# Patient Record
Sex: Male | Born: 1965 | Race: White | Hispanic: No | Smoking: Never smoker
Health system: Southern US, Community
[De-identification: ages and names within clinical notes are randomized; demographics above are authoritative.]

## PROBLEM LIST (undated history)

## (undated) DIAGNOSIS — Z87442 Personal history of urinary calculi: Secondary | ICD-10-CM

## (undated) DIAGNOSIS — N059 Unspecified nephritic syndrome with unspecified morphologic changes: Secondary | ICD-10-CM

## (undated) DIAGNOSIS — K219 Gastro-esophageal reflux disease without esophagitis: Secondary | ICD-10-CM

## (undated) DIAGNOSIS — L0390 Cellulitis, unspecified: Secondary | ICD-10-CM

## (undated) DIAGNOSIS — I10 Essential (primary) hypertension: Secondary | ICD-10-CM

## (undated) DIAGNOSIS — I872 Venous insufficiency (chronic) (peripheral): Secondary | ICD-10-CM

## (undated) DIAGNOSIS — G4730 Sleep apnea, unspecified: Secondary | ICD-10-CM

## (undated) DIAGNOSIS — A4902 Methicillin resistant Staphylococcus aureus infection, unspecified site: Secondary | ICD-10-CM

## (undated) DIAGNOSIS — M1990 Unspecified osteoarthritis, unspecified site: Secondary | ICD-10-CM

## (undated) DIAGNOSIS — R569 Unspecified convulsions: Secondary | ICD-10-CM

## (undated) HISTORY — PX: KNEE ARTHROSCOPY: SUR90

## (undated) HISTORY — PX: MOUTH SURGERY: SHX715

## (undated) HISTORY — PX: APPENDECTOMY: SHX54

## (undated) HISTORY — PX: SHOULDER SURGERY: SHX246

---

## 1999-04-26 ENCOUNTER — Ambulatory Visit: Admission: RE | Admit: 1999-04-26 | Discharge: 1999-04-26 | Payer: Self-pay | Admitting: Family Medicine

## 2002-03-09 ENCOUNTER — Observation Stay (HOSPITAL_COMMUNITY): Admission: RE | Admit: 2002-03-09 | Discharge: 2002-03-10 | Payer: Self-pay | Admitting: Specialist

## 2003-01-21 ENCOUNTER — Observation Stay (HOSPITAL_COMMUNITY): Admission: RE | Admit: 2003-01-21 | Discharge: 2003-01-22 | Payer: Self-pay | Admitting: Specialist

## 2003-06-09 ENCOUNTER — Ambulatory Visit (HOSPITAL_BASED_OUTPATIENT_CLINIC_OR_DEPARTMENT_OTHER): Admission: RE | Admit: 2003-06-09 | Discharge: 2003-06-09 | Payer: Self-pay | Admitting: Specialist

## 2004-09-23 ENCOUNTER — Emergency Department (HOSPITAL_COMMUNITY): Admission: EM | Admit: 2004-09-23 | Discharge: 2004-09-24 | Payer: Self-pay | Admitting: Emergency Medicine

## 2006-01-14 ENCOUNTER — Ambulatory Visit (HOSPITAL_COMMUNITY): Admission: RE | Admit: 2006-01-14 | Discharge: 2006-01-14 | Payer: Self-pay | Admitting: Family Medicine

## 2008-02-23 ENCOUNTER — Ambulatory Visit (HOSPITAL_COMMUNITY): Admission: RE | Admit: 2008-02-23 | Discharge: 2008-02-23 | Payer: Self-pay | Admitting: Family Medicine

## 2008-08-17 ENCOUNTER — Emergency Department (HOSPITAL_COMMUNITY): Admission: EM | Admit: 2008-08-17 | Discharge: 2008-08-17 | Payer: Self-pay | Admitting: Emergency Medicine

## 2008-12-28 ENCOUNTER — Emergency Department (HOSPITAL_COMMUNITY): Admission: EM | Admit: 2008-12-28 | Discharge: 2008-12-28 | Payer: Self-pay | Admitting: Emergency Medicine

## 2008-12-28 DIAGNOSIS — I499 Cardiac arrhythmia, unspecified: Secondary | ICD-10-CM

## 2008-12-28 HISTORY — DX: Cardiac arrhythmia, unspecified: I49.9

## 2009-01-02 ENCOUNTER — Ambulatory Visit: Payer: Self-pay | Admitting: Cardiology

## 2009-01-06 ENCOUNTER — Encounter: Payer: Self-pay | Admitting: Cardiology

## 2009-01-06 ENCOUNTER — Ambulatory Visit (HOSPITAL_COMMUNITY): Admission: RE | Admit: 2009-01-06 | Discharge: 2009-01-06 | Payer: Self-pay | Admitting: Cardiology

## 2009-01-06 ENCOUNTER — Ambulatory Visit: Payer: Self-pay | Admitting: Internal Medicine

## 2009-01-20 ENCOUNTER — Ambulatory Visit: Payer: Self-pay | Admitting: Cardiology

## 2009-01-25 ENCOUNTER — Ambulatory Visit (HOSPITAL_COMMUNITY): Admission: RE | Admit: 2009-01-25 | Discharge: 2009-01-25 | Payer: Self-pay | Admitting: Cardiology

## 2009-05-14 ENCOUNTER — Emergency Department (HOSPITAL_COMMUNITY): Admission: EM | Admit: 2009-05-14 | Discharge: 2009-05-14 | Payer: Self-pay | Admitting: Emergency Medicine

## 2009-05-15 DIAGNOSIS — G4730 Sleep apnea, unspecified: Secondary | ICD-10-CM | POA: Insufficient documentation

## 2009-05-16 ENCOUNTER — Ambulatory Visit: Payer: Self-pay | Admitting: Cardiology

## 2009-05-17 ENCOUNTER — Emergency Department (HOSPITAL_COMMUNITY): Admission: EM | Admit: 2009-05-17 | Discharge: 2009-05-17 | Payer: Self-pay | Admitting: Emergency Medicine

## 2009-05-18 ENCOUNTER — Telehealth (INDEPENDENT_AMBULATORY_CARE_PROVIDER_SITE_OTHER): Payer: Self-pay

## 2009-05-19 ENCOUNTER — Emergency Department (HOSPITAL_COMMUNITY): Admission: EM | Admit: 2009-05-19 | Discharge: 2009-05-19 | Payer: Self-pay | Admitting: Emergency Medicine

## 2009-08-09 ENCOUNTER — Emergency Department (HOSPITAL_COMMUNITY): Admission: EM | Admit: 2009-08-09 | Discharge: 2009-08-09 | Payer: Self-pay | Admitting: Emergency Medicine

## 2009-11-18 DIAGNOSIS — D165 Benign neoplasm of lower jaw bone: Secondary | ICD-10-CM

## 2009-11-18 HISTORY — DX: Benign neoplasm of lower jaw bone: D16.5

## 2010-01-03 ENCOUNTER — Ambulatory Visit (HOSPITAL_COMMUNITY): Admission: RE | Admit: 2010-01-03 | Discharge: 2010-01-03 | Payer: Self-pay | Admitting: Oral Surgery

## 2010-01-08 ENCOUNTER — Encounter (INDEPENDENT_AMBULATORY_CARE_PROVIDER_SITE_OTHER): Payer: Self-pay | Admitting: *Deleted

## 2010-01-08 LAB — CONVERTED CEMR LAB
AST: 20 units/L
Albumin: 4 g/dL
CO2: 26 meq/L
Creatinine, Ser: 1.03 mg/dL
Glucose, Bld: 91 mg/dL
Hgb A1c MFr Bld: 6.1 %
LDL Cholesterol: 58 mg/dL
Sodium: 139 meq/L
TSH: 1.225 microintl units/mL
Total Protein: 6.4 g/dL
Triglycerides: 64 mg/dL

## 2010-01-15 ENCOUNTER — Encounter (INDEPENDENT_AMBULATORY_CARE_PROVIDER_SITE_OTHER): Payer: Self-pay | Admitting: *Deleted

## 2010-01-17 ENCOUNTER — Ambulatory Visit: Payer: Self-pay | Admitting: Cardiology

## 2010-01-18 ENCOUNTER — Encounter: Payer: Self-pay | Admitting: Adult Health

## 2010-03-28 ENCOUNTER — Emergency Department (HOSPITAL_COMMUNITY): Admission: EM | Admit: 2010-03-28 | Discharge: 2010-03-29 | Payer: Self-pay | Admitting: Emergency Medicine

## 2010-03-29 ENCOUNTER — Encounter: Payer: Self-pay | Admitting: Adult Health

## 2010-03-29 ENCOUNTER — Ambulatory Visit: Payer: Self-pay | Admitting: Cardiology

## 2010-03-29 DIAGNOSIS — R079 Chest pain, unspecified: Secondary | ICD-10-CM | POA: Insufficient documentation

## 2010-03-29 DIAGNOSIS — E669 Obesity, unspecified: Secondary | ICD-10-CM | POA: Insufficient documentation

## 2010-04-03 ENCOUNTER — Ambulatory Visit (HOSPITAL_COMMUNITY)
Admission: RE | Admit: 2010-04-03 | Discharge: 2010-04-04 | Payer: Self-pay | Source: Home / Self Care | Admitting: Oral Surgery

## 2010-04-03 ENCOUNTER — Encounter (INDEPENDENT_AMBULATORY_CARE_PROVIDER_SITE_OTHER): Payer: Self-pay | Admitting: Oral Surgery

## 2010-04-18 ENCOUNTER — Encounter (HOSPITAL_COMMUNITY): Admission: RE | Admit: 2010-04-18 | Discharge: 2010-04-19 | Payer: Self-pay | Admitting: Cardiology

## 2010-04-18 ENCOUNTER — Ambulatory Visit: Payer: Self-pay | Admitting: Cardiology

## 2010-04-23 ENCOUNTER — Ambulatory Visit: Payer: Self-pay | Admitting: Cardiology

## 2010-06-18 ENCOUNTER — Ambulatory Visit (HOSPITAL_COMMUNITY): Admission: RE | Admit: 2010-06-18 | Discharge: 2010-06-19 | Payer: Self-pay | Admitting: Oral Surgery

## 2010-12-20 NOTE — Assessment & Plan Note (Signed)
Summary: F/U MYOVIEW TO BE DONE ON 04/18/10/TG   Visit Type:  Follow-up Primary Provider:  Dr.Stephen Sudie Bailey   History of Present Illness: John [name] returns to the office as scheduled for continued assessment and treatment of chest discomfort.  He has not experienced recurrence since his initial episode.  He has a history of gastroesophageal reflux disease, uses a PPI occasionally, but does not feel that this episode was similar to those symptoms.  He describes sharp upper substernal chest discomfort radiating through to the back.  There is mild associated dyspnea and diaphoresis without nausea.  He was awakened from sleep with these symptoms, which resolved after he arose from bed.  Current Medications (verified): 1)  Diltiazem Hcl Er Beads 360 Mg Xr24h-Cap (Diltiazem Hcl Er Beads) .... Take 1 Tab Daily 2)  Diltiazem Hcl Er Beads 240 Mg Xr24h-Cap (Diltiazem Hcl Er Beads) .... Take 1 Tab Daily 3)  Lasix 40 Mg Tabs (Furosemide) .... Take 1 Tab Daily 4)  Klor-Con M20 20 Meq Cr-Tabs (Potassium Chloride Crys Cr) .... Take 1 Tab Two Times A Day 5)  Glucosamine 500 Mg Caps (Glucosamine Sulfate) .... Take 1 Tab Two Times A Day 6)  Aspir-Trin 325 Mg Tbec (Aspirin) .... Take 1 Tab Daily 7)  Mobic 7.5 Mg Tabs (Meloxicam) .... Take 1 Tab Daily 8)  Norco 5-325 Mg Tabs (Hydrocodone-Acetaminophen) .... Take As Needed For Pain  Allergies (verified): 1)  ! Penicillin  Past History:  PMH, FH, and Social History reviewed and updated.  Past Medical History: PAROXYSMAL ATRIAL FIBRILLATION (ICD-427.31) Chest pain-negative stress nuclear study in 04/2010 SLEEP APNEA (ICD-780.57) MORBID OBESITY (ICD-278.01) Plantar fasciitis  Review of Systems       See history of present illness.  Vital Signs:  Patient profile:   45 year old male Weight:      371 pounds Pulse rate:   75 / minute BP sitting:   124 / 86  (right arm)  Vitals Entered By: John Saa, CNA (April 23, 2010 3:02  PM)  Physical Exam  General:    Overweight; no acute distress:    Neck-No JVD, no carotid bruits: Lungs-No tachypnea, no rales; no rhonchi;no wheezes; decreased breath sounds at the bases; minimal rales at the left base-clear with cough Cardiovascular-normal PMI; normal S1 and S2; S4 present Abdomen-BS normal; soft and non-tender without masses or organomegaly:  Skin-Warm, no significant lesions: Extremities-Nl distal pulses; 1/2+ edema; fairly prominent varicose veins   Impression & Recommendations:  Problem # 1:  ATRIAL FIBRILLATION-PAROXYSMAL (ICD-427.31) No recurrence of arrhythmia has been documented since initial onset more than one year ago; however, patient was asymptomatic at that time.  Current medications for this problem have been well-tolerated and do not convey a significant risk.  Problem # 2:  CHEST PAIN (ICD-786.50) Symptoms have apparently resolved.  Stress nuclear study was of suboptimal quality, but no definite evidence for ischemia or infarction was noted.  LV systolic function was normal.  No further evaluation or treatment is necessary at the present time.  I will plan to reassess this nice gentleman in one year.  He is cautioned to call earlier should chest discomfort return or other symptoms develop with a likely cardiovascular etiology.  Patient Instructions: 1)  Your physician recommends that you schedule a follow-up appointment in: 1 year

## 2010-12-20 NOTE — Assessment & Plan Note (Signed)
Summary: ROV CHEST PAIN AND BACK PAIN   Visit Type:  Follow-up Primary Provider:  Dr.Stephen Sudie Bailey  CC:  .Marland Kitchen  History of Present Illness: [name] is a 45 y/o morbidly obese CM with known history of SVT, Right heart enlargement and diliation  per echo Feb 2010, OSA with use of CPAP; who we are seeing today after being seen in the ER for complaints of chest pain.  His pain began on 03/27/2010 and is described as "sharp, tight, radiating to the back between the shoulder blades and down the R arm.  He was also diaphoretic with this.  He took an Aciphex and sat up.  Pain eventially diminshed but remained.  The following evening the pain became worse again, but this time radiated into his neck as well, but not down his Right arm.  He was seen in the ER and labs did not reveal any abnormalities with the exception of a low normal K+ 3.4,  CXR did not show CHF but did show cardiomegaly.  EKG showed HR of 48bpm without ischemic changes.  He was given Rx for vicodin and asked to follow-up with [place] and his primary care physician.  He is without pain at this time, having taken the Vicodin.  He admits to chronic back, knee and shoulder pain, but none occuring all at once while lying down.  He admits to inconsistancy with using CPAP, and taking some medications.  Current Medications (verified): 1)  Diltiazem Hcl Er Beads 360 Mg Xr24h-Cap (Diltiazem Hcl Er Beads) .... Take 1 Tab Daily 2)  Diltiazem Hcl Er Beads 240 Mg Xr24h-Cap (Diltiazem Hcl Er Beads) .... Take 1 Tab Daily 3)  Lasix 40 Mg Tabs (Furosemide) .... Take 1 Tab Daily 4)  Klor-Con M20 20 Meq Cr-Tabs (Potassium Chloride Crys Cr) .... Take 1 Tab Daily 5)  Glucosamine 500 Mg Caps (Glucosamine Sulfate) .... Take 1 Tab Two Times A Day 6)  Aspir-Trin 325 Mg Tbec (Aspirin) .... Take 1 Tab Daily 7)  Mobic 7.5 Mg Tabs (Meloxicam) .... Take 1 Tab Daily 8)  Norco 5-325 Mg Tabs (Hydrocodone-Acetaminophen) .... Take As Needed For Pain  Allergies  (verified): 1)  ! Penicillin  Review of Systems       Radiation to the back, neck and right shoulder.  All other systems have been reviewed and are negative unless stated above.   Vital Signs:  Patient profile:   45 year old male Weight:      376 pounds Pulse rate:   58 / minute BP sitting:   128 / 86  (right arm)  Vitals Entered By: Dreama Saa, CNA (Mar 29, 2010 11:31 AM)  Physical Exam  General:  Well developed, well nourished, in no acute distress. Neck:  Obese Lungs:  Clear bilaterally to auscultation and percussion. Heart:  Non-displaced PMI, chest non-tender; regular rate and rhythm, S1, S2 without murmurs, rubs or gallops. Carotid upstroke normal, no bruit. Normal abdominal aortic size, no bruits. Femorals normal pulses, no bruits. Pedals normal pulses.  Abdomen:  Bowel sounds positive; abdomen soft and non-tender without masses, organomegaly, or hernias noted. No hepatosplenomegaly. Obese Very Msk:  joint tenderness bilateral knees and in neck with ROM, also in lower back with palpation.  Some ROM discomfort moving R shoulder and arm. Pulses:  pulses normal in all 4 extremities Extremities:  Bilateral dependent edema with multiple varicosities. Neurologic:  Alert and oriented x 3. Psych:  Normal affect.   EKG  Procedure date:  03/29/2010  Findings:  Sinus bradycardia with rate of:  48bpm  Impression & Recommendations:  Problem # 1:  CHEST PAIN (ICD-786.50) This pain appears atypical in this setting.  More likely musculosketal.  However, with CVRF of obesity, Hypertension, and FH, I will schedule a 2 DAY lexiscan/myoview.  His body habitus may make this difficult, but do not want to proceed with cath at this time, as I do not feel this is warrented. He is advised to take his medications as directed.  He was bradycardic in ER.  May need to decrease the cardizem dose to 240mg  two times a day from current doses.  Will test him at current status now and make  adjustments if necessary. His updated medication list for this problem includes:    Diltiazem Hcl Er Beads 360 Mg Xr24h-cap (Diltiazem hcl er beads) .Marland Kitchen... Take 1 tab daily    Diltiazem Hcl Er Beads 240 Mg Xr24h-cap (Diltiazem hcl er beads) .Marland Kitchen... Take 1 tab daily    Aspir-trin 325 Mg Tbec (Aspirin) .Marland Kitchen... Take 1 tab daily  Orders: Nuclear Stress Test (Nuc Stress Test)  Problem # 2:  HYPOKALEMIA, MILD (ICD-276.8) I have advised him to take of potassium today and then continue usual dose of daily there after.  Problem # 3:  SLEEP APNEA (ICD-780.57) He is advised to use CPAP as directed.  Patient Instructions: 1)  Your physician has recommended you make the following change in your medication: **tonight only:  take extra tablet of Potassium (Klor-con) .  then go back to . once daily  2)  Your physician recommends that you schedule a follow-up appointment in: post stress test

## 2010-12-20 NOTE — Miscellaneous (Signed)
Summary: labs cmp,lipids,a1c 01/08/2010  Clinical Lists Changes  Observations: Added new observation of CALCIUM: 8.3 mg/dL (04/54/0981 19:14) Added new observation of ALBUMIN: 4.0 g/dL (78/29/5621 30:86) Added new observation of PROTEIN, TOT: 6.4 g/dL (57/84/6962 95:28) Added new observation of SGPT (ALT): 19 units/L (01/08/2010 16:22) Added new observation of SGOT (AST): 20 units/L (01/08/2010 16:22) Added new observation of ALK PHOS: 89 units/L (01/08/2010 16:22) Added new observation of CREATININE: 1.03 mg/dL (41/32/4401 02:72) Added new observation of BUN: 15 mg/dL (53/66/4403 47:42) Added new observation of BG RANDOM: 91 mg/dL (59/56/3875 64:33) Added new observation of CO2 PLSM/SER: 26 meq/L (01/08/2010 16:22) Added new observation of CL SERUM: 103 meq/L (01/08/2010 16:22) Added new observation of K SERUM: 4.6 meq/L (01/08/2010 16:22) Added new observation of NA: 139 meq/L (01/08/2010 16:22) Added new observation of LDL: 58 mg/dL (29/51/8841 66:06) Added new observation of HDL: 46 mg/dL (30/16/0109 32:35) Added new observation of TRIGLYC TOT: 64 mg/dL (57/32/2025 42:70) Added new observation of CHOLESTEROL: 117 mg/dL (62/37/6283 15:17) Added new observation of TSH: 1.225 microintl units/mL (01/08/2010 16:22) Added new observation of HGBA1C: 6.1 % (01/08/2010 16:22)

## 2010-12-20 NOTE — Letter (Signed)
Summary: [place] [facility], agricultural at [facility]  [address] [place] [address]   Phone: [phone]  Fax: [phone]      April 23, 2010 MRN: [id number]   John [name] [address] [place], [place]  [address]   Dear [name],  Your test ordered by Selena Batten has been reviewed by your physician (or physician assistant) and was found to be normal or stable. Your physician (or physician assistant) felt no changes were needed at this time.  ____ Echocardiogram  __X__ Cardiac Stress Test  ____ Lab Work  ____ Peripheral vascular study of arms, legs or neck  ____ CT scan or X-ray  ____ Lung or Breathing test  ____ Other: Please continue on current medical treatment.  Thank you.   [place] Bing, MD, F.A.C.C

## 2010-12-20 NOTE — Assessment & Plan Note (Signed)
Summary: 9 mth f/u per checkout on 05/16/09/tg   Visit Type:  Follow-up Primary Provider:  Dr.Stephen Sudie Bailey  CC:  no complaints today.  History of Present Illness: John [name] is a 45 y/o morbidly obese CM we are following for SVT.  He has not been seen in the office since June of 2010.  He was placed on cardiazem 360mg  in am and 240mg  in pm and has had not further episodes of rapid HR.  On last visit he was also being treated for MRSA cellulits of the LLE.  He has chronic LEE and lymphedema.  He also wears a CPAP at bedtime for OSA.  He had a recent ECHO dated 01/06/2009 revealing overall LV fx 65% without WMA or LVH.  He did have a moderately dialated RV with RV systolic fx mildly reduced.  Since being seen last, he continues to have LEE and has gained115 lbs. He sees his primary care physician tomorrow and labs are being drawn at that time.  He continues on Lasix and Potassium.  He denies any symptoms other than those listed above.  Current Medications (verified): 1)  Diltiazem Hcl Er Beads 360 Mg Xr24h-Cap (Diltiazem Hcl Er Beads) .... Take 1 Tab Daily 2)  Diltiazem Hcl Er Beads 240 Mg Xr24h-Cap (Diltiazem Hcl Er Beads) .... Take 1 Tab Daily 3)  Lasix 40 Mg Tabs (Furosemide) .... Take 1 Tab Daily 4)  Klor-Con M20 20 Meq Cr-Tabs (Potassium Chloride Crys Cr) .... Take 1 Tab Daily 5)  Glucosamine 500 Mg Caps (Glucosamine Sulfate) .... Take 1 Tab Daily 6)  Aspir-Trin 325 Mg Tbec (Aspirin) .... Take 1 Tab Daily  Allergies (verified): 1)  ! Penicillin PMH-FH-SH reviewed-no changes except otherwise noted  Review of Systems       The patient complains of weight gain and peripheral edema.         All other systems have been reviewed and are negative unless stated above.   Vital Signs:  Patient profile:   45 year old male Height:      72 inches Weight:      374 pounds BMI:     50.91 Pulse rate:   73 / minute BP sitting:   127 / 83  (right arm)  Vitals Entered By: Dreama Saa,  CNA (January 17, 2010 2:50 PM)  Physical Exam  General:  Well developed, well nourishe morbidly obese, in no acute distress. Lungs:  Diminished BS decreased BS bilateral.  Poor inspiratory effort. Heart:  Distant HS without MRG Abdomen:  Very Obese, NT 2+ BS Msk:  decreased ROM.  in LE bilaterally Extremities:  2+ left pedal edema and 1+ right pedal edema.   Neurologic:  Alert and oriented x 3. Skin:  Venous stasis skin changes, Psych:  Normal affect.   EKG  Procedure date:  01/17/2010  Findings:      Sinus bradycardia with rate of:  59 bpm  Impression & Recommendations:  Problem # 1:  PAROXYSMAL ATRIAL FIBRILLATION (ICD-427.31) Assessment Improved No further episodes at this time. Continue current medications. His updated medication list for this problem includes:    Aspir-trin 325 Mg Tbec (Aspirin) .Marland Kitchen... Take 1 tab daily  Problem # 2:  MORBID OBESITY (ICD-278.01) Discussion of weight loss and increased activity.  Patient Instructions: 1)  Your physician recommends that you schedule a follow-up appointment in: 12 months 2)  Your physician recommends that you continue on your current medications as directed. Please refer to the Current Medication list given to  you today.

## 2010-12-20 NOTE — Letter (Signed)
Summary: [place] [facility] (Nuc Med Stress)  [facility]  [address], [place] [address]   Phone: [phone]  Fax: [phone]    Nuclear Medicine 1-Day Stress Test Information Sheet  Re:     John [name]   DOB:     [birth year] MRN:     [id number] Weight:  Appointment Date: Register at: Appointment Time: Referring MD:  ___Exercise Stress  __Adenosine   __Dobutamine  _X_Lexiscan  __Persantine   __Thallium  Urgency: ____1 (next day)   ____2 (one week)    ____3 (PRN)  Patient will receive Follow Up call with results: Patient needs follow-up appointment:  Instructions regarding medication:  How to prepare for your stress test: 1. DO NOT eat or dring 6 hours prior to your arrival time. This includes no caffeine (coffee, tea, sodas, chocolate) if you were instructed to take your medications, drink water with it. 2. DO NOT use any tobacco products for at leaset 8 hours prior to arrival. 3. DO NOT wear dresses or any clothing that may have metal clasps or buttons. 4. Wear short sleeve shirts, loose clothing, and comfortalbe walking shoes. 5. DO NOT use lotions, oils or powder on your chest before the test. 6. The test will take approximately 3-4 hours from the time you arrive until completion. 7. To register the day of the test, go to the Short Stay entrance at [facility]. 8. If you must cancel your test, call [phone] as soon as you are aware. 9. Do not take Diltiazem or Lasix the morning of procedure. After you arrive for test:   When you arrive at [facility], you will go to Short Stay to be registered. They will then send you to Radiology to check in. The Nuclear Medicine Tech will get you and start an IV in your arm or hand. A small amount of a radioactive tracer will then be injected into your IV. This tracer will then have to circulate for 30-45 minutes. During this time you will wait in the waiting room and you will be able to drink  something without caffeine. A series of pictures will be taken of your heart follwoing this waiting period. After the 1st set of pictures you will go to the stress lab to get ready for your stress test. During the stress test, another small amount of a radioactive tracer will be injected through your IV. When the stress test is complete, there is a short rest period while your heart rate and blood pressure will be monitored. When this monitoring period is complete you will have another set of pictrues taken. (The same as the 1st set of pictures). These pictures are taken between 15 minutes and 1 hour after the stress test. The time depends on the type of stress test you had. Your doctor will inform you of your test results within 7 days after test.    The possibilities of certain changes are possible during the test. They include abnormal blood pressure and disorders of the heart. Side effects of persantine or adenosine can include flushing, chest pain, shortness of breath, stomach tightness, headache and light-headedness. These side effects usually do not last long and are self-resolving. Every effort will be made to keep you comfortable and to minimize complications by obtaining a medical history and by close observation during the test. Emergency equipment, medications, and trained personnel are available to deal with any unusual situation which may arise.  Please notify office at least  48 hours in advance if you are unable to keep this appt.

## 2011-02-02 LAB — BASIC METABOLIC PANEL
BUN: 11 mg/dL (ref 6–23)
CO2: 31 mEq/L (ref 19–32)
Creatinine, Ser: 1.05 mg/dL (ref 0.4–1.5)
GFR calc Af Amer: >60 mL/min (ref >60)
GFR calc non Af Amer: >60 mL/min (ref >60)
Glucose, Bld: 90 mg/dL (ref 70–99)

## 2011-02-02 LAB — CBC
HCT: 45.3 % (ref 39.0–52.0)
MCV: 88.6 fL (ref 78.0–100.0)
Platelets: 176 10*3/uL (ref 150–400)
RBC: 5.11 MIL/uL (ref 4.22–5.81)
RDW: 13.9 % (ref 11.5–15.5)
WBC: 6.4 10*3/uL (ref 4.0–10.5)

## 2011-02-02 LAB — SURGICAL PCR SCREEN: MRSA, PCR: NEGATIVE

## 2011-02-04 LAB — BASIC METABOLIC PANEL
GFR calc Af Amer: >60 mL/min (ref >60)
GFR calc non Af Amer: >60 mL/min (ref >60)
Potassium: 4.5 mEq/L (ref 3.5–5.1)
Sodium: 139 mEq/L (ref 135–145)

## 2011-02-04 LAB — CBC
HCT: 45.2 % (ref 39.0–52.0)
Hemoglobin: 15.5 g/dL (ref 13.0–17.0)
MCHC: 34.3 g/dL (ref 30.0–36.0)
RBC: 5.15 MIL/uL (ref 4.22–5.81)
RDW: 14.5 % (ref 11.5–15.5)
WBC: 6.7 10*3/uL (ref 4.0–10.5)

## 2011-02-05 LAB — DIFFERENTIAL
Basophils Relative: 0 % (ref 0–1)
Eosinophils Absolute: 0.3 10*3/uL (ref 0.0–0.7)
Lymphs Abs: 2 10*3/uL (ref 0.7–4.0)
Monocytes Absolute: 0.9 10*3/uL (ref 0.1–1.0)
Neutrophils Relative %: 52 % (ref 43–77)

## 2011-02-05 LAB — CBC
HCT: 38.1 % — ABNORMAL LOW (ref 39.0–52.0)
Hemoglobin: 13.3 g/dL (ref 13.0–17.0)
MCHC: 35 g/dL (ref 30.0–36.0)
MCV: 85.5 fL (ref 78.0–100.0)
RBC: 4.45 MIL/uL (ref 4.22–5.81)
RDW: 14.5 % (ref 11.5–15.5)
WBC: 6.7 10*3/uL (ref 4.0–10.5)

## 2011-02-05 LAB — POCT CARDIAC MARKERS
CKMB, poc: 1.5 ng/mL (ref 1.0–8.0)
Myoglobin, poc: 60.7 ng/mL (ref 12–200)

## 2011-02-05 LAB — BASIC METABOLIC PANEL
CO2: 30 mEq/L (ref 19–32)
GFR calc non Af Amer: >60 mL/min (ref >60)

## 2011-02-28 LAB — BLOOD GAS, ARTERIAL
Acid-Base Excess: 1.5 mmol/L (ref 0.0–2.0)
Bicarbonate: 25.7 mEq/L — ABNORMAL HIGH (ref 20.0–24.0)
FIO2: 21 %
pCO2 arterial: 42.3 mmHg (ref 35.0–45.0)
pO2, Arterial: 91.1 mmHg (ref 80.0–100.0)

## 2011-03-05 LAB — DIFFERENTIAL
Basophils Absolute: 0 10*3/uL (ref 0.0–0.1)
Monocytes Absolute: 0.6 10*3/uL (ref 0.1–1.0)
Monocytes Relative: 10 % (ref 3–12)
Neutro Abs: 3.9 10*3/uL (ref 1.7–7.7)
Neutrophils Relative %: 60 % (ref 43–77)

## 2011-03-05 LAB — BASIC METABOLIC PANEL
BUN: 11 mg/dL (ref 6–23)
Calcium: 8.6 mg/dL (ref 8.4–10.5)
Chloride: 104 mEq/L (ref 96–112)
Glucose, Bld: 89 mg/dL (ref 70–99)
Potassium: 3.9 mEq/L (ref 3.5–5.1)
Sodium: 140 mEq/L (ref 135–145)

## 2011-03-05 LAB — TSH: TSH: 1.235 u[IU]/mL (ref 0.350–4.500)

## 2011-03-05 LAB — LIPID PANEL
Cholesterol: 78 mg/dL (ref 0–200)
HDL: 30 mg/dL — ABNORMAL LOW (ref >39)
LDL Cholesterol: 33 mg/dL (ref 0–99)
Total CHOL/HDL Ratio: 2.6 RATIO
VLDL: 15 mg/dL (ref 0–40)

## 2011-03-05 LAB — URINALYSIS, ROUTINE W REFLEX MICROSCOPIC
Bilirubin Urine: NEGATIVE
pH: 6 (ref 5.0–8.0)

## 2011-03-05 LAB — CBC
HCT: 45.6 % (ref 39.0–52.0)
Hemoglobin: 15.5 g/dL (ref 13.0–17.0)
MCHC: 34 g/dL (ref 30.0–36.0)
Platelets: 173 10*3/uL (ref 150–400)
RDW: 14.1 % (ref 11.5–15.5)

## 2011-03-05 LAB — POCT CARDIAC MARKERS: Troponin i, poc: <0.05 ng/mL (ref 0.00–0.09)

## 2011-04-02 NOTE — Letter (Signed)
May 16, 2009    Mila Homer. Sudie Bailey, MD  [address] [place], [place] [address]   RE:  HOLTEN, SPANO  MRN:  [id number]  /  DOB:  [birth year]   Dear Brett Canales:   Mr. Bufford returns to the office as scheduled for continued assessment  and treatment of paroxysmal supraventricular tachycardia as well as  obesity/sleep apnea, and mild hypertension.  Since his last visit, he  has done generally well.  He notes no palpitations, but he did not  experience palpitations with his index arrhythmia either.  He continues  to diet, but has not lost additional weight.  He reports no chest  discomfort, lightheadedness, syncope, nor dyspnea.   He was seen in the emergency department 2 days ago for a skin infection  over the dorsal aspect of his right forearm.  No blood studies were  obtained.  He was afebrile.  He was treated with doxycycline.  Since  then, he has noted no change in the clinical appearance of his lesions.   Current medications include:  1. Diltiazem, total of 600 mg per day in divided doses.  2. Furosemide 40 mg daily.  3. KCL 20 mEq daily.  4. Doxycycline 100 mg b.i.d.   PHYSICAL EXAMINATION:  GENERAL:  Pleasant, overweight gentleman, in no  acute distress.  VITAL SIGNS:  The weight is 359, 3 pounds more than 3 months ago.  Blood  pressure 130/90, heart rate 85 and regular, respirations 14 and  unlabored.  NECK:  No jugular venous distention; no carotid bruits.  LUNGS:  Clear.  CARDIAC:  Normal first and second heart sounds.  ABDOMEN:  Soft and nontender; no organomegaly.  EXTREMITIES:  Mild surface varicosities; trace edema on the left; normal  distal pulses.  SKIN:  A furuncle approximately 1-2 inches in diameter is present over  the right forearm as well as a smaller similar lesion.  These have  apparent punctures centrally, where purulent material has drained.  There is surrounding erythema covering approximately 4 inches x 8  inches.  There is warmth over the  entire region.  There is no clear  fluctuance and no current drainage.  The patient is afebrile.   IMPRESSION:  Mr. Hartshorn is doing fine with respect to his  cardiovascular issues.  Blood pressure control is slightly suboptimal,  but this may relate to his current medical issues.  I suggested  continuing weight loss.   He has a cellulitis, possibly community acquired MRSA.  He is receiving  an appropriate antibiotic.  He is using hot compresses or soaks as well.  He will consult you if he does not improve in the next few days or if he  worsens.  I will plan to see this nice gentleman again in 9 months.    Sincerely,      Gerrit Friends. Dietrich Pates, MD, [facility]  Electronically Signed    RMR/MedQ  DD: 05/16/2009  DT: 05/17/2009  Job #: [id number]

## 2011-04-02 NOTE — Letter (Signed)
January 20, 2009    Mila Homer. Sudie Bailey, MD  [address] [place], [place] [address]   RE:  John [name], John [name]  MRN:  [id number]  /  DOB:  1966-06-07   Dear Brett Canales,   Mr. Laday returns to the office for continued assessment and  treatment of paroxysmal atrial fibrillation.  Since his last visit, he  has noted no symptoms.  His lifestyle is sedentary.  He can perhaps walk  up one flight of stairs without limiting fatigue and/or dyspnea.  He has  noted no edema.  He does have sleep apnea and is treated with a CPAP  mask.   Medications are unchanged from his last visit.   An echocardiogram was performed.  This showed mild LVH with normal left  ventricular systolic function, no valvular abnormalities, but moderate  right ventricular enlargement with a degree of right ventricular  dysfunction.  His TSH was normal.  His event recorder has shown no  recurrent atrial fibrillation to date.   PHYSICAL EXAMINATION:  GENERAL:  Overweight gentleman in no acute  distress.  VITAL SIGNS:  The weight is 356, 5 pounds less than at his last visit.  Blood pressure 150/75, heart rate 80 and regular, respirations 12 and  unlabored.  NECK:  No jugular venous distention; no carotid bruits.  LUNGS:  Decreased breath sounds; otherwise clear.  CARDIAC:  Normal first heart sounds; perhaps slight accentuation of  second heart sounds; minimal basilar systolic ejection murmur; no  precordial lifts.  ABDOMEN:  Soft; nontender; normal bowel sounds; no bruits.  EXTREMITIES:  No edema.   IMPRESSION:  Mr. Speciale is doing fine with respect to his symptoms.  At least based upon the event recorder, no atrial fibrillation has  recurred.  He does have evidence of right-sided cardiac enlargement and  dysfunction, most likely related to his sleep apnea or to Pickwickian  syndrome.  Pulmonary function tests and an arterial blood gas will be  obtained.  He is congratulated on the loss of 90 pounds so far and on  his continuing efforts.  I will plan to reassess this nice gentleman  again in 3 months.  If atrial fibrillation recurs with a rapid  ventricular response, he will require treatment with a beta-blocker.    Sincerely,      Gerrit Friends. Dietrich Pates, MD, [facility]  Electronically Signed    RMR/MedQ  DD: 01/20/2009  DT: 01/21/2009  Job #: [id number]

## 2011-04-02 NOTE — Letter (Signed)
January 02, 2009    Mila Homer. Sudie Bailey, MD  [address] [place], [place] [address]   RE:  RICKIE, GANGE  MRN:  [id number]  /  DOB:  July 24, 1966   Dear Brett Canales,   It was my pleasure evaluating Mr. Widdowson in the office today in  consultation at your request for atrial fibrillation.  As you know, this  nice gentleman has enjoyed generally good health.  He does have history  of hypertension and obesity, but has lost a considerable amount of  weight in recent months.  Blood pressure control has been excellent on  diltiazem.  During a routine visit to your office last week, he was  noted to have a rapid irregular pulse.  The EKG reportedly showed atrial  fibrillation.  He was referred to the emergency department.  By the time  he arrived, he had converted back to sinus rhythm.  Basic labs were  performed including a normal CBC, normal chemistry profile, and normal  urinalysis.  TSH was not measured.  He has not yet had an  echocardiogram.   He was asymptomatic while atrial fibrillation was present.  He does  recall 1 episode of palpitations at some point in the past.   Past medical history is most notable for sleep apnea for which he uses  positive pressure device at night.  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  He also  suffers from plantar fasciitis.   Surgeries have included an appendectomy at age 85, a shoulder procedure  in 2004, left knee arthroscopic surgery in 2003.   He reports an allergy to PENICILLIN.   CURRENT MEDICATIONS:  1. Diltiazem 360 mg q.a.m. and 240 mg q.p.m.  2. Furosemide 40 mg daily.  3. KCl 1 daily.  4. Glucosamine 2 daily.   SOCIAL HISTORY:  Disabled due to orthopedic problems in the lower  extremities; unmarried with 1 child; no use of tobacco products nor  excessive use of alcohol.   FAMILY HISTORY:  Positive for neoplastic disease, COPD, diabetes, and  coronary disease in a  grandparent.  He has 3 siblings who are alive and  well.   REVIEW OF SYSTEMS:  Notable for occasional headaches, an episode of  diarrhea, arthritic discomfort in his back pain, tendonitis in the right  wrist, and intermittent trace edema.  All other systems reviewed and are  negative.   PHYSICAL EXAMINATION:  GENERAL:  On exam, pleasant, overweight gentleman  in no acute distress.  VITAL SIGNS:  The weight is 361, heart rate 45 and regular, respirations  14, blood pressure 120/80.  HEENT:  Anicteric sclerae; EOMs full; normal lids and conjunctivae;  normal oral mucosa.  NECK:  No jugular venous distention; no carotid bruits.  ENDOCRINE:  Mild diffuse thyromegaly.  HEMATOPOIETIC:  No adenopathy.  SKIN:  No significant lesions.  LUNGS:  Clear.  CARDIAC:  Normal first and second heart sounds.  No murmur nor gallop  appreciated.  ABDOMEN:  Soft and nontender; no organomegaly.  EXTREMITIES:  Trace edema; distal pulses intact.  NEUROLOGIC:  Symmetric strength and tone; normal cranial nerves.   EKG:  Sinus bradycardia; borderline left axis; otherwise unremarkable.  Comparison with a prior tracing of March 09, 2002:  Axis is shifted  somewhat leftward; heart rate has decreased.   IMPRESSION:  Mr. Noyce has paroxysmal atrial fibrillation, apparently  without symptoms.  Hypertension is his only known risk factor for  thromboembolic disease.  His blood pressure appears to be extremely well-  controlled at the present time.  The frequency or duration of his  arrhythmia is unknown, but it would appear that he likely has fair  amount of atrial fibrillation or it would have been highly unlikely to  catch that in your office during a routine visit.  We will proceed with  an echocardiogram, a TSH level, and an event recorder to try to document  the amount of time he spends in atrial fibrillation.  It is surprising  that his heart rate was so high when he is on such a high dose of  diltiazem.   He will be likely need another rate control medication.  I  doubt that he requires antiarrhythmic therapy.  He is a borderline  candidate for anticoagulation in light of a fairly low risk of  thromboembolism.  I am not inclined to start warfarin  at the present time.  I will re-assess this nice gentleman after the  above studies have been completed.   Thanks so much for sending him to see me.    Sincerely,      Gerrit Friends. Dietrich Pates, MD, [facility]  Electronically Signed    RMR/MedQ  DD: 01/02/2009  DT: 01/03/2009  Job #: [id number]

## 2011-04-05 NOTE — Procedures (Signed)
NAMEJEFFRE, ENRIQUES            ACCOUNT NO.:  [id number]   MEDICAL RECORD NO.:  [id number]          PATIENT TYPE:  OUT   LOCATION:  RESP                          FACILITY:  [facility]   PHYSICIAN:  Edward L. Juanetta Gosling, M.D.DATE OF BIRTH:  November 17, 1966   DATE OF PROCEDURE:  DATE OF DISCHARGE:                            PULMONARY FUNCTION TEST   1. Spirometry shows no ventilatory defect and mild airflow obstruction      noted at the level of the smaller airways.  2. Lung volumes show mild restrictive change.  3. DLCO is normal.  4. Arterial blood gases are normal.  5. Noting the patient's height and weight, the changes in lung volumes      may be related to body habitus.      Edward L. Juanetta Gosling, M.D.  Electronically Signed     ELH/MEDQ  D:  01/28/2009  T:  01/28/2009  Job:  [id number]   cc:   Gerrit Friends. Dietrich Pates, MD, [facility]  [address]  [place], [place] [address]

## 2011-04-05 NOTE — Op Note (Signed)
[facility]  Patient:    John [name], John [name] Visit Number: [id number] MRN: [id number]          Service Type: DSU Location: DAY Attending Physician:  Erasmo Leventhal Dictated by:   Elvera Lennox Valma Cava, M.D. Proc. Date: 03/09/02 Admit Date:  03/09/2002                             Operative Report  PREOPERATIVE DIAGNOSES: 1. Left shoulder symptomatic acromioclavicular joint. 2. Possible torn labrum. 3. Posttraumatic impingement syndrome.  POSTOPERATIVE DIAGNOSES: 1. Left shoulder traumatic tear of glenoid labrum. 2. Multiple chondral loose bodies. 3. Chondromalacia, grade 3, inferior aspect of glenoid. 4. Posttraumatic rotator cuff impingement syndrome. 5. Symptomatic degenerative acromioclavicular joint.  PROCEDURE: 1. Left shoulder arthroscopic removal of multiple chondral loose bodies. 2. Chondroplasty of glenoid. 3. Debridement of intraarticular labral tears. 4. Arthroscopic subacromial decompression. 5. Arthroscopic distal clavicle resection, Mumford procedure.  SURGEON:  R. Valma Cava, M.D.  ASSISTANT:  Marcie Bal. Troncale, P.A.-C.  ANESTHESIA:  General. Dr. Shireen Quan.  ESTIMATED BLOOD LOSS:  Less than 10 cc.  DRAINS:  None.  COMPLICATIONS:  None.  DISPOSITION:  PACU stable.  OPERATIVE DETAILS:  The patient was counseled in the holding area.  Correct site was identified.  IV had been started.  Antibiotics were given.  Taken to the OR, placed in supine position, general anesthesia.  This gentleman has a large body habitus, and the surgery was very difficult secondary to that.  He was turned to a right lateral decubitus position, properly padded and bumped throughout.  We were very cautious with his positioning secondary to his morbid obesity.  Left shoulder examined, full range of motion, stable, prepped with DuraPrep, and all were draped in a sterile fashion.  Open reduction shoulder position was utilized with 30 degrees  abduction, 10 degrees of forward flexion, 20 pounds longitudinal traction.  Posterior portal was created, arthroscope placed in glenohumeral joint.  Diagnostic arthroscopy of the glenohumeral joint revealed multiple chondral loose bodies.  Anterior portal was created with outside to end technique through the rotator cuff interval.  The motorized shaver was introduced, and these chondral loose bodies were removed.  They originated from an area of grade 3 and small area of grade 4 chondromalacia inferior aspect of glenoid, ______ was traumatic in nature, and a chondroplasty performed with a mechanical shaver back to a stable base.  Also, correction of grade 2-3 chondromalacia of the humeral head and chondroplasty performed there.  There were multiple radial tears of the labrum circumferentially, utilized the motorized shaver.  The labrum was debrided back to healthy tissue.  It was found to be stable and not detached.  Shoulder was stable.  Rotator cuff was intact.  Shoulder girdle was copiously irrigated and arthroscopic equipment was removed.  Then 10 cc of 0.5% Marcaine with epinephrine were placed in the shoulder joint.  Subacromial ridge revealed a very thickened, inflamed subacromial bursa. Anterolateral portal was created, staying proximal to ______ nerve. Motorized shaver was then introduced.  Subacromial bursectomy was performed. The rotator cuff and bursal surface was intact.  The Arthrocare system was utilized to release the periosteum and CA ligament. Bur was then placed posteriorly, and anteroinferior acromioplasty was performed, decompressing the subacromial region.  Hemostasis was obtained.  Attention directed to distal clavicle.  It was found to be osteoarthritic.  The capsule removed with the shaver.  A bur was then placed, and the lateral 1.5  cm clavicle was removed with the bur, leaving the superior and anteroposterior capsule intact.  The clavicle was palpated  and found to be stable.  Subacromial ridge was debrided, and meticulous hemostasis was performed.  No other abnormalities were noted. Copious irrigation and all arthroscopic equipment was removed.  Taken out traction.  The two portals were closed with 4-0 nylon suture. Another 15 cc of 0.5% Marcaine with epinephrine placed in the portal sites and subacromial region for pain.  Sterile compressive dressing applied to the shoulder.  He was then rolled onto his bed very gently and safely.  Excellent pulse at the end of the case, awakened and extubated, taken to the operating room to PACU in stable condition.  There were no complications.  Sponge and needle counts were correct. Dictated by:   R. Valma Cava, M.D. Attending Physician:  Erasmo Leventhal DD:  03/09/02 TD:  03/09/02 Job: [id number]

## 2011-04-05 NOTE — Op Note (Signed)
NAME:  John [name], SCHULTES NO.:  [id number]   MEDICAL RECORD NO.:  [id number]                   PATIENT TYPE:  AMB   LOCATION:  DAY                                  FACILITY:  [facility]   PHYSICIAN:  Erasmo Leventhal, M.D.         DATE OF BIRTH:  Apr 07, 1966   DATE OF PROCEDURE:  01/21/2003  DATE OF DISCHARGE:                                 OPERATIVE REPORT   PREOPERATIVE DIAGNOSIS:  Left knee torn meniscus, osteoarthritis.   POSTOPERATIVE DIAGNOSES:  Left knee complex tear of lateral meniscus,  osteoarthritis grade 3 of the patella, focal chondral defect, medial femoral  condyle, 2 x 3 cm.   PROCEDURES:  1. Left arthroscopic partial lateral meniscectomy.  2. Bicompartmental chondroplasty.   SURGEON:  Erasmo Leventhal, M.D.   ASSISTANT:  Jaquelyn Bitter. Chabon, P.A.-C.   ANESTHESIA:  Local MAC.   ESTIMATED BLOOD LOSS:  Less than 10 mL.   DRAINS:  None.   COMPLICATIONS:  None.   DISPOSITION:  PACU stable.   DESCRIPTION OF PROCEDURE:  In the holding area, the patient's IV was started  and antibiotics were given. A knee block was administered.  Intraarticular  block was administered by Dr. Rica Mast. He was taken to the OR, placed in the  supine position, and administered MAC anesthesia; however, due to his sleep  apnea we basically kept him as little sedation as possible and he was for  the most part during a significant part of the case. However, he was very  comfortable. The left lower extremity was elevated, it was prepped with  Duraprep and vi-draped in a sterile fashion. The three portals were  anesthetized with 10 mL of 15 lidocaine with epinephrine. At this point in  time, a three portal arthroscopy was begun with the proximal, medial and  anterolateral portal. Diagnostic arthroscopy was then undertaken.  Patellofemoral joint revealed normal tracking with grade 2 chondromalacia of  the patella medial and lateral.  The ACL and PCL were  intact. The medial  compartment was inspected and there was a 2 x 2 focal chondral defect in the  medial femoral condyle with delamination of the articular cartilage and the  bone. This was debrided back to a stable rim with a motorized shaver. The  meniscus was intact. The lateral side was inspected. There was an extremely  complex tear of the lateral meniscal involving the anterior, mid, and  posterior one-thirds.  Osteotomized with a motorized shaver and a partial  lateral meniscectomy was performed back to a stable base with __________  contour. No other abnormalities were noted specifically posteriorly. The  knee was then sequentially irrigated and arthroscopic equipment was removed.  __________ with Prolene suture.  Another 20 mL of 0.5% Marcaine with  epinephrine was placed in the knee joint at the end of the case for  postoperative pain and hemostasis. The patient tolerated the procedure well  with no complications. Sponge and needle counts  were correct.  The patient  was placed in a sterile dressing, taken from operating room to the PACU in  stable condition.                                               Erasmo Leventhal, M.D.    RAC/MEDQ  D:  01/21/2003  T:  01/21/2003  Job:  [id number]

## 2011-04-05 NOTE — Op Note (Signed)
NAME:  John [name], John [name] NO.:  [id number]   MEDICAL RECORD NO.:  [id number]                   PATIENT TYPE:  AMB   LOCATION:  DSC                                  FACILITY:  [facility]   PHYSICIAN:  Erasmo Leventhal, M.D.         DATE OF BIRTH:  July 26, 1966   DATE OF PROCEDURE:  06/09/2003  DATE OF DISCHARGE:                                 OPERATIVE REPORT   PREOPERATIVE DIAGNOSIS:  Left knee probable recurrent lateral meniscal tear.   POSTOPERATIVE DIAGNOSIS:  Left knee posterior horn tear medial meniscus,  recurrent __________ grade 2 chondromalacia of the femoral condyle, and  grade 3 lateral compartment.   PROCEDURE:  Left knee arthroscopic partial medial meniscectomy, partial  lateral meniscectomy, bicompartmental chondroplasty.   SURGEON:  Erasmo Leventhal, M.D.   ASSISTANT:  Halford Decamp, P.A.-C.   ANESTHESIA:  Local with MAC.   ESTIMATED BLOOD LOSS:  Less than 10 mL.   DRAINS:  None.   COMPLICATIONS:  None.   TOURNIQUET TIME:  None.   DISPOSITION:  PACU stable.   PROCEDURE IN DETAIL:  The patient and family were counseled in the holding  area, correct side was identified.  IV was started, antibiotics were given,  block was administered.  He was taken to the operating room and placed on  the table in supine position under MAC anesthesia and monitored by the  anesthesia team.  The left lower extremity elevated, prepped with DuraPrep  and draped in a sterile fashion.  Portals were reanesthetized by myself with  10 mL of 1% lidocaine with epinephrine.  Posteromedial cannula was  established through a small stab wound and a cannula was placed into the  suprapatellar pouch.  The knee was instilled with normal saline using the  pump.  Anteromedial and anterolateral portals were established.  Diagnostic  arthroscopy was undertaken.  The patellofemoral joint was unremarkable as  was the suprapatellar pouch and medial and lateral  gutters.  ACL and PCL  were intact.  The medial compartment was inspected, grade 2 chondromalacia  of the medial femoral condyle and a chondroplasty was performed with  mechanical shaver back to a stable base.  The posterior horn of the lateral  meniscus revealed a complex unstable tear, utilizing the baskets and  motorized shaver, a partial medial meniscectomy was performed back to stable  base, properly beveled, and contoured.  Lateral side was inspected, grade 2  early grade 3 chondromalacia, mechanical chondroplasty was performed with  mechanical shaver on both sides back to a stable base.  Recurrent tear of  the anterior horn of the lateral meniscus, utilizing motorized shaver,  partial lateral meniscectomy was performed back to a stable base, properly  beveled and contoured.  The knee was then sequentially reinspected, there  were no other abnormalities noted.  After copious irrigation, the  arthroscopic equipment was removed.  Throughout the entire case, the patient  was kept comfortable and we were  very cautious with the collateral ligaments  and neurovascular structures.  The portals were closed with 4-0 nylon  suture.  Another 30 mL of 0.25% Marcaine with epinephrine and 4 mg morphine  sulfate were used for pain and hemostasis as a knee block.  A sterile  compressive dressing was applied along with an Ancef.  The patient was  awakened in the operating room and taken to the PACU in stable condition.  Sponge and needle counts were correct, there were no complications.                                               Erasmo Leventhal, M.D.    RAC/MEDQ  D:  06/09/2003  T:  06/09/2003  Job:  [id number]

## 2011-08-19 LAB — CBC
Hemoglobin: 16.1
MCHC: 33.5
MCV: 87.8
RBC: 5.48
WBC: 7

## 2011-08-19 LAB — POCT CARDIAC MARKERS
CKMB, poc: 1.2
CKMB, poc: 2
Troponin i, poc: <0.05
Troponin i, poc: <0.05

## 2011-08-19 LAB — BASIC METABOLIC PANEL
CO2: 31
Chloride: 104
Creatinine, Ser: 1.05
GFR calc Af Amer: >60
Sodium: 139

## 2011-08-19 LAB — DIFFERENTIAL
Basophils Relative: 0
Eosinophils Absolute: 0.2
Lymphs Abs: 2
Monocytes Absolute: 0.8
Monocytes Relative: 11
Neutro Abs: 4
Neutrophils Relative %: 57

## 2011-09-09 ENCOUNTER — Encounter: Payer: Self-pay | Admitting: Emergency Medicine

## 2011-09-09 ENCOUNTER — Emergency Department (HOSPITAL_COMMUNITY): Payer: Medicare Other

## 2011-09-09 ENCOUNTER — Inpatient Hospital Stay (HOSPITAL_COMMUNITY)
Admission: EM | Admit: 2011-09-09 | Discharge: 2011-09-16 | DRG: 603 | Disposition: A | Discharge status: Home / Self Care | Payer: Medicare Other | Attending: Family Medicine | Admitting: Family Medicine

## 2011-09-09 DIAGNOSIS — G4730 Sleep apnea, unspecified: Secondary | ICD-10-CM | POA: Diagnosis present

## 2011-09-09 DIAGNOSIS — L0390 Cellulitis, unspecified: Secondary | ICD-10-CM

## 2011-09-09 DIAGNOSIS — R7309 Other abnormal glucose: Secondary | ICD-10-CM | POA: Diagnosis present

## 2011-09-09 DIAGNOSIS — Z8614 Personal history of Methicillin resistant Staphylococcus aureus infection: Secondary | ICD-10-CM

## 2011-09-09 DIAGNOSIS — L03119 Cellulitis of unspecified part of limb: Principal | ICD-10-CM | POA: Diagnosis present

## 2011-09-09 DIAGNOSIS — I10 Essential (primary) hypertension: Secondary | ICD-10-CM | POA: Diagnosis present

## 2011-09-09 DIAGNOSIS — Z6841 Body Mass Index (BMI) 40.0 and over, adult: Secondary | ICD-10-CM

## 2011-09-09 DIAGNOSIS — L02419 Cutaneous abscess of limb, unspecified: Principal | ICD-10-CM | POA: Diagnosis present

## 2011-09-09 HISTORY — DX: Essential (primary) hypertension: I10

## 2011-09-09 HISTORY — DX: Methicillin resistant Staphylococcus aureus infection, unspecified site: A49.02

## 2011-09-09 HISTORY — DX: Cellulitis, unspecified: L03.90

## 2011-09-09 HISTORY — DX: Unspecified osteoarthritis, unspecified site: M19.90

## 2011-09-09 LAB — DIFFERENTIAL
Basophils Absolute: 0 10*3/uL (ref 0.0–0.1)
Basophils Relative: 0 % (ref 0–1)
Lymphocytes Relative: 2 % — ABNORMAL LOW (ref 12–46)
Neutro Abs: 15.4 10*3/uL — ABNORMAL HIGH (ref 1.7–7.7)
Neutrophils Relative %: 91 % — ABNORMAL HIGH (ref 43–77)

## 2011-09-09 LAB — CBC
HCT: 46.1 % (ref 39.0–52.0)
MCHC: 33.8 g/dL (ref 30.0–36.0)
RDW: 13.8 % (ref 11.5–15.5)
WBC: 17 10*3/uL — ABNORMAL HIGH (ref 4.0–10.5)

## 2011-09-09 LAB — COMPREHENSIVE METABOLIC PANEL
ALT: 30 U/L (ref 0–53)
AST: 35 U/L (ref 0–37)
Albumin: 3.6 g/dL (ref 3.5–5.2)
Alkaline Phosphatase: 125 U/L — ABNORMAL HIGH (ref 39–117)
CO2: 27 mEq/L (ref 19–32)
Chloride: 95 mEq/L — ABNORMAL LOW (ref 96–112)
GFR calc non Af Amer: 70 mL/min — ABNORMAL LOW (ref >90)
Potassium: 3.5 mEq/L (ref 3.5–5.1)
Sodium: 131 mEq/L — ABNORMAL LOW (ref 135–145)
Total Bilirubin: 1.2 mg/dL (ref 0.3–1.2)

## 2011-09-09 MED ORDER — SODIUM CHLORIDE 0.9 % IJ SOLN
INTRAMUSCULAR | Status: AC
  Administered 2011-09-09: 10 mL
  Filled 2011-09-09: qty 10

## 2011-09-09 MED ORDER — HYDROCODONE-ACETAMINOPHEN 5-325 MG PO TABS
1.0000 | ORAL_TABLET | Freq: Four times a day (QID) | ORAL | Status: DC | PRN
  Administered 2011-09-09: 1 via ORAL
  Filled 2011-09-09: qty 1

## 2011-09-09 MED ORDER — ACETAMINOPHEN 325 MG PO TABS: 650.0000 mg | ORAL_TABLET | ORAL | Status: DC | PRN

## 2011-09-09 MED ORDER — DILTIAZEM HCL ER COATED BEADS 180 MG PO CP24
360.0000 mg | ORAL_CAPSULE | Freq: Every day | ORAL | Status: DC
  Administered 2011-09-10 – 2011-09-16 (×7): 360 mg via ORAL
  Filled 2011-09-09 (×4): qty 2
  Filled 2011-09-09: qty 1
  Filled 2011-09-09 (×3): qty 2

## 2011-09-09 MED ORDER — SODIUM CHLORIDE 0.9 % IV BOLUS (SEPSIS)
1000.0000 mL | Freq: Once | INTRAVENOUS | Status: AC
  Administered 2011-09-09: 1000 mL via INTRAVENOUS

## 2011-09-09 MED ORDER — SODIUM CHLORIDE 0.9 % IV SOLN
Freq: Once | INTRAVENOUS | Status: AC
  Administered 2011-09-09: 13:00:00 via INTRAVENOUS

## 2011-09-09 MED ORDER — ASPIRIN 325 MG PO TABS
325.0000 mg | ORAL_TABLET | Freq: Every day | ORAL | Status: DC
  Administered 2011-09-10 – 2011-09-16 (×7): 325 mg via ORAL
  Filled 2011-09-09 (×7): qty 1

## 2011-09-09 MED ORDER — BIOTENE DRY MOUTH MT LIQD
Freq: Two times a day (BID) | OROMUCOSAL | Status: DC
  Administered 2011-09-09 – 2011-09-11 (×4): via OROMUCOSAL
  Administered 2011-09-12: 1 via OROMUCOSAL
  Administered 2011-09-13: 08:00:00 via OROMUCOSAL
  Administered 2011-09-13: 1 via OROMUCOSAL
  Administered 2011-09-14: 08:00:00 via OROMUCOSAL
  Administered 2011-09-14 – 2011-09-15 (×2): 1 via OROMUCOSAL
  Administered 2011-09-15 – 2011-09-16 (×2): via OROMUCOSAL

## 2011-09-09 MED ORDER — DILTIAZEM HCL ER COATED BEADS 240 MG PO CP24
240.0000 mg | ORAL_CAPSULE | Freq: Every day | ORAL | Status: DC
  Administered 2011-09-09 – 2011-09-15 (×7): 240 mg via ORAL
  Filled 2011-09-09 (×8): qty 1

## 2011-09-09 MED ORDER — SODIUM CHLORIDE 0.9 % IV SOLN
8.0000 mg | Freq: Four times a day (QID) | INTRAVENOUS | Status: DC | PRN
  Administered 2011-09-10: 8 mg via INTRAVENOUS
  Filled 2011-09-09: qty 4

## 2011-09-09 MED ORDER — ONDANSETRON HCL 4 MG/2ML IJ SOLN
4.0000 mg | Freq: Once | INTRAMUSCULAR | Status: AC
  Administered 2011-09-09: 4 mg via INTRAVENOUS
  Filled 2011-09-09: qty 2

## 2011-09-09 MED ORDER — SODIUM CHLORIDE 0.9 % IV SOLN
INTRAVENOUS | Status: DC
  Administered 2011-09-09 – 2011-09-10 (×2): via INTRAVENOUS

## 2011-09-09 MED ORDER — LORATADINE 10 MG PO TABS: 10.0000 mg | ORAL_TABLET | Freq: Every day | ORAL | Status: DC | PRN

## 2011-09-09 MED ORDER — HYDROMORPHONE HCL 1 MG/ML IJ SOLN
1.0000 mg | Freq: Once | INTRAMUSCULAR | Status: AC
  Administered 2011-09-09: 1 mg via INTRAVENOUS
  Filled 2011-09-09: qty 1

## 2011-09-09 MED ORDER — VANCOMYCIN HCL 1000 MG IV SOLR
1500.0000 mg | Freq: Two times a day (BID) | INTRAVENOUS | Status: DC
  Administered 2011-09-09 – 2011-09-15 (×13): 1500 mg via INTRAVENOUS
  Filled 2011-09-09 (×17): qty 1500

## 2011-09-09 MED ORDER — HYDROMORPHONE HCL 1 MG/ML IJ SOLN
2.0000 mg | INTRAMUSCULAR | Status: DC | PRN
  Administered 2011-09-09 – 2011-09-10 (×3): 2 mg via INTRAVENOUS
  Filled 2011-09-09 (×3): qty 2

## 2011-09-09 MED ORDER — VANCOMYCIN HCL IN DEXTROSE 1-5 GM/200ML-% IV SOLN
1000.0000 mg | Freq: Once | INTRAVENOUS | Status: AC
  Administered 2011-09-09: 1000 mg via INTRAVENOUS
  Filled 2011-09-09: qty 200

## 2011-09-09 MED ORDER — FUROSEMIDE 40 MG PO TABS: 40.0000 mg | ORAL_TABLET | Freq: Two times a day (BID) | ORAL | Status: DC | PRN

## 2011-09-09 MED ORDER — CYCLOBENZAPRINE HCL 10 MG PO TABS: 10.0000 mg | ORAL_TABLET | Freq: Three times a day (TID) | ORAL | Status: DC | PRN

## 2011-09-09 NOTE — ED Notes (Signed)
MD at bedside. 

## 2011-09-09 NOTE — ED Notes (Signed)
Patient with pain to left LE with HA and nausea/vomiting since this morning, noted left LE with redness and warm to touch, pt unaware for how long redness present

## 2011-09-09 NOTE — ED Provider Notes (Cosign Needed)
History   This chart was scribed for John Lennert, MD by Clarita Crane. The patient was seen in room APA14/AP[facility]14 and the patient's care was started at 12:22PM.   CSN: [id number] Arrival date & time: 09/09/2011 12:03 PM   First MD Initiated Contact with Patient 09/09/11 1208      Chief Complaint  Patient presents with  . Leg Pain   HPI John [name] is a 45 y.o. male who presents to the Emergency Department complaining of constant non-radiating left lower leg pain with associated redness to posterior and anterior aspect of left lower leg, nausea, vomiting, HA, fever and myalgias which began approximately 9.5 hours ago and has been persistent since. Denies diarrhea, chest pain, abdominal pain. Patient reports his left lower leg is chronically red but redness became significantly worse this morning. States he has been evaluated previously for similar symptoms in ED and was advised to be admitted for inpatient care but he refused and was d/c home.   PCP- Sudie Bailey  Past Medical History  Diagnosis Date  . Cellulitis   . Hypertension   . Arthritis   . MRSA (methicillin resistant Staphylococcus aureus)     Past Surgical History  Procedure Date  . Mouth surgery   . Knee arthroscopy   . Shoulder surgery   . Appendectomy     No family history on file.  History  Substance Use Topics  . Smoking status: Never Smoker   . Smokeless tobacco: Not on file  . Alcohol Use: Yes     occasionally      Review of Systems  Constitutional: Positive for fever. Negative for fatigue.  HENT: Negative for congestion, sinus pressure and ear discharge.   Eyes: Negative for discharge.  Respiratory: Negative for cough.   Cardiovascular: Negative for chest pain.  Gastrointestinal: Positive for nausea and vomiting. Negative for abdominal pain and diarrhea.  Genitourinary: Negative for frequency and hematuria.  Musculoskeletal: Positive for myalgias. Negative for back pain.  Skin: Negative for  rash.  Neurological: Positive for headaches. Negative for seizures.  Hematological: Negative.   Psychiatric/Behavioral: Negative for hallucinations.    Allergies  Penicillins  Home Medications   Current Outpatient Rx  Name Route Sig Dispense Refill  . ASPIRIN 325 MG PO TABS Oral Take 325 mg by mouth daily.      Marland Kitchen CETIRIZINE HCL 10 MG PO TABS Oral Take 10 mg by mouth daily as needed. allergies     . CLOBETASOL PROPIONATE 0.05 % EX CREA Topical Apply 1 application topically 2 (two) times daily. cellulitis     . VITAMIN B 12 PO Oral Take 1 tablet by mouth daily.      . CYCLOBENZAPRINE HCL 10 MG PO TABS Oral Take 10 mg by mouth 3 (three) times daily as needed. Muscle spasms     . DILTIAZEM HCL COATED BEADS 360 MG PO CP24 Oral Take 360 mg by mouth daily.      Marland Kitchen DILTIAZEM HCL CR 240 MG PO CP24 Oral Take 240 mg by mouth at bedtime.      . FUROSEMIDE 40 MG PO TABS Oral Take 40 mg by mouth 2 (two) times daily as needed. Swelling, takes along with potassium     . HYDROCODONE-ACETAMINOPHEN 5-325 MG PO TABS Oral Take 1 tablet by mouth every 6 (six) hours as needed. For pain     . POTASSIUM CHLORIDE 20 MEQ PO PACK Oral Take 20 mEq by mouth 2 (two) times daily as needed. Along with fluid  BP 105/50  Pulse 133  Temp(Src) 98.6 F (37 C) (Oral)  Resp 20  Ht 6' (1.829 m)  Wt 396 lb (179.624 kg)  BMI 53.71 kg/m2  SpO2 99%  Physical Exam  Nursing note and vitals reviewed. Constitutional: He is oriented to person, place, and time. He appears well-developed and well-nourished. No distress.       Obese.   HENT:  Head: Normocephalic and atraumatic.  Eyes: EOM are normal. No scleral icterus.  Neck: Neck supple.  Cardiovascular: Normal rate and regular rhythm.  Exam reveals no gallop and no friction rub.   No murmur heard. Pulmonary/Chest: Effort normal. No stridor. No respiratory distress. He has no wheezes. He has no rales.  Abdominal: Soft. He exhibits no distension. There is no  tenderness. There is no rebound.  Musculoskeletal: Normal range of motion. He exhibits no edema and no tenderness.  Neurological: He is alert and oriented to person, place, and time. Coordination normal.  Skin: Skin is warm and dry.       Diffuse red rash to left lower leg c/w cellulitis  Psychiatric: He has a normal mood and affect. His behavior is normal.    ED Course  Procedures (including critical care time)  DIAGNOSTIC STUDIES: Oxygen Saturation is 99% on room air, normal by my interpretation.    COORDINATION OF CARE:    Results for orders placed during the hospital encounter of 09/09/11  CBC      Component Value Range   WBC 17.0 (*) 4.0 - 10.5 (K/uL)   RBC 5.39  4.22 - 5.81 (MIL/uL)   Hemoglobin 15.6  13.0 - 17.0 (g/dL)   HCT 45.4  09.8 - 11.9 (%)   MCV 85.5  78.0 - 100.0 (fL)   MCH 28.9  26.0 - 34.0 (pg)   MCHC 33.8  30.0 - 36.0 (g/dL)   RDW 14.7  82.9 - 56.2 (%)   Platelets 151  150 - 400 (K/uL)  DIFFERENTIAL      Component Value Range   Neutrophils Relative 91 (*) 43 - 77 (%)   Neutro Abs 15.4 (*) 1.7 - 7.7 (K/uL)   Lymphocytes Relative 2 (*) 12 - 46 (%)   Lymphs Abs 0.4 (*) 0.7 - 4.0 (K/uL)   Monocytes Relative 7  3 - 12 (%)   Monocytes Absolute 1.2 (*) 0.1 - 1.0 (K/uL)   Eosinophils Relative 0  0 - 5 (%)   Eosinophils Absolute 0.0  0.0 - 0.7 (K/uL)   Basophils Relative 0  0 - 1 (%)   Basophils Absolute 0.0  0.0 - 0.1 (K/uL)  COMPREHENSIVE METABOLIC PANEL      Component Value Range   Sodium 131 (*) 135 - 145 (mEq/L)   Potassium 3.5  3.5 - 5.1 (mEq/L)   Chloride 95 (*) 96 - 112 (mEq/L)   CO2 27  19 - 32 (mEq/L)   Glucose, Bld 118 (*) 70 - 99 (mg/dL)   BUN 15  6 - 23 (mg/dL)   Creatinine, Ser 1.30  0.50 - 1.35 (mg/dL)   Calcium 9.1  8.4 - 86.5 (mg/dL)   Total Protein 6.7  6.0 - 8.3 (g/dL)   Albumin 3.6  3.5 - 5.2 (g/dL)   AST 35  0 - 37 (U/L)   ALT 30  0 - 53 (U/L)   Alkaline Phosphatase 125 (*) 39 - 117 (U/L)   Total Bilirubin 1.2  0.3 - 1.2 (mg/dL)     GFR calc non Af Amer 70 (*) >90 (  mL/min)   GFR calc Af Amer 81 (*) >90 (mL/min)    US Venous Img Lower Unilateral Left  09/09/2011  *RADIOLOGY REPORT*  Clinical Data: Left leg pain and erythema  LEFT LOWER EXTREMITY VENOUS DUPLEX ULTRASOUND  Technique:  Gray-scale sonography with graded compression, as well as color Doppler and duplex ultrasound, were performed to evaluate the deep venous system of the lower extremity from the level of the common femoral vein through the popliteal and proximal calf veins. Spectral Doppler was utilized to evaluate flow at rest and with distal augmentation maneuvers.  Comparison:  None.  Findings: There is good lumen compression and flow augmentation within the common femoral, profunda femoral, superficial femoral, popliteal veins of the left lower extremity.  No intraluminal filling defects are identified to suggest thrombus.  IMPRESSION: There is no evidence of DVT within the left lower extremity.  Original Report Authenticated By: Brandon Melnick, M.D.     1. Cellulitis       MDM    Dr. Sudie Bailey to admit    The chart was scribed for me under my direct supervision.  I personally performed the history, physical, and medical decision making and all procedures in the evaluation of this patient.John Lennert, MD 09/09/11 [phone]

## 2011-09-09 NOTE — ED Notes (Signed)
Pt states he began having pain and redness in left leg at 3:00 am this morning.  NAD.

## 2011-09-09 NOTE — Progress Notes (Signed)
ANTIBIOTIC CONSULT NOTE - INITIAL  Pharmacy Consult for Vancomycin Indication: cellulitis  Allergies  Allergen Reactions  . Penicillins     Patient Measurements: Height: 6' (182.9 cm) Weight: 396 lb (179.624 kg) IBW/kg (Calculated) : 77.6    Vital Signs: Temp: 100.7 F (38.2 C) (10/22 2054) Temp src: Oral (10/22 2054) BP: 115/68 mmHg (10/22 2054) Pulse Rate: 88  (10/22 2054) Intake/Output from previous day:   Intake/Output from this shift:    Labs:  Basename 09/09/11 1239  WBC 17.0*  HGB 15.6  PLT 151  LABCREA --  CREATININE 1.21   Estimated Creatinine Clearance: 127.8 ml/min (by C-G formula based on Cr of 1.21). No results found for this basename: VANCOTROUGH:2,VANCOPEAK:2,VANCORANDOM:2,GENTTROUGH:2,GENTPEAK:2,GENTRANDOM:2,TOBRATROUGH:2,TOBRAPEAK:2,TOBRARND:2,AMIKACINPEAK:2,AMIKACINTROU:2,AMIKACIN:2, in the last 72 hours   Microbiology: No results found for this or any previous visit (from the past 720 hour(s)).  Medical History: Past Medical History  Diagnosis Date  . Cellulitis   . Hypertension   . Arthritis   . MRSA (methicillin resistant Staphylococcus aureus)     Medications:  Scheduled:    . sodium chloride   Intravenous Once  . antiseptic oral rinse   Mouth Rinse BID  . aspirin  325 mg Oral Daily  . diltiazem  240 mg Oral QHS  . diltiazem  360 mg Oral Daily  . HYDROmorphone  1 mg Intravenous Once  . ondansetron  4 mg Intravenous Once  . sodium chloride  1,000 mL Intravenous Once  . sodium chloride      . vancomycin  1,500 mg Intravenous Q12H  . vancomycin  1,000 mg Intravenous Once   Assessment: Ok for protocol  Goal of Therapy:  Vancomycin trough level 10-15 mcg/ml  Plan:  Vancomycin 1500 mg IV every 12 hours, after initial 1 Gm dose given in ER Measure antibiotic drug levels at steady state  [address], [place] 09/09/2011,9:13 PM

## 2011-09-10 LAB — MRSA PCR SCREENING: MRSA by PCR: NEGATIVE

## 2011-09-10 MED ORDER — SODIUM CHLORIDE 0.9 % IJ SOLN
INTRAMUSCULAR | Status: AC
  Filled 2011-09-10: qty 10

## 2011-09-10 MED ORDER — ONDANSETRON HCL 4 MG/2ML IJ SOLN
INTRAMUSCULAR | Status: AC
  Filled 2011-09-10: qty 4

## 2011-09-10 MED ORDER — POLYVINYL ALCOHOL 1.4 % OP SOLN
1.0000 [drp] | OPHTHALMIC | Status: DC | PRN
  Administered 2011-09-10: 1 [drp] via OPHTHALMIC
  Filled 2011-09-10: qty 15

## 2011-09-10 NOTE — Progress Notes (Signed)
John [name], John [name]            ACCOUNT NO.:  [id number]  MEDICAL RECORD NO.:  [id number]  LOCATION:  A314                          FACILITY:  [facility]  PHYSICIAN:  Mila Homer. Sudie Bailey, M.D.DATE OF BIRTH:  [birth year]  DATE OF PROCEDURE: DATE OF DISCHARGE:                                PROGRESS NOTE   SUBJECTIVE:  His leg feels somewhat better.  OBJECTIVE:  VITAL SIGNS:  Temperature is 98.7.  It was up to 100.7 last night.  Pulse is 94, respiratory rate 18, blood pressure 92/61, O2 saturations 92%. GENERAL APPEARANCE:  He is somewhat recumbent in bed and morbidly obese. His color is good. HEART:  Has a regular rhythm, rate of about 80. LUNGS:  Are clear throughout.  He is moving air well. EXTREMITIES:  He still has erythema of the left lower leg.  There are no cuts or scrapes on the left leg or foot.  There is no tinea of the foot.  ASSESSMENT: 1. Cellulitis of the left leg. 2. History of MRSA infection. 3. Morbid obesity. 4. Hypertension.  PLAN:  Continue vancomycin IV.  Repeat CBC tomorrow.  Switch from IV to saline lock.     Mila Homer. Sudie Bailey, M.D.     SDK/MEDQ  D:  09/10/2011  T:  09/10/2011  Job:  [id number]

## 2011-09-10 NOTE — Plan of Care (Signed)
Problem: Consults Goal: Diabetes Guidelines if Diabetic/Glucose > 140 If diabetic or lab glucose is > 140 mg/dl - Initiate Diabetes/Hyperglycemia Guidelines & Document Interventions  Outcome: Not Applicable Date Met:  09/10/11 Not diabetic

## 2011-09-10 NOTE — H&P (Signed)
John John [name], John [name]            ACCOUNT NO.:  [id number]  MEDICAL RECORD NO.:  [id number]  LOCATION:  A314                          FACILITY:  [facility]  PHYSICIAN:  Mila Homer. Sudie Bailey, M.D.DATE OF BIRTH:  October 02, 1966  DATE OF ADMISSION:  09/09/2011 DATE OF DISCHARGE:  LH                             HISTORY & PHYSICAL   This 45 year old was feeling well yesterday, but then last night developed fever, chills, and a red and swollen left leg.  He does have a history of MRSA infection of his right arm.  CURRENT HOME MEDICATIONS:  Include: 1. Aspirin 325 mg daily. 2. Cetirizine 10 mg daily for allergy. 3. Vitamin B12 daily. 4. Cyclobenzaprine 10 mg t.i.d. for muscle spasm. 5. Diltiazem 360 mg q.a.m. and diltiazem 240 mg at bedtime, both long-     acting. 6. Furosemide 40 mg b.i.d. for swelling. 7. KCl 20 mEq b.i.d. with furosemide. 8. Hydrocodone/APAP 5/325 q.6 hours for pain. He has had a history of cellulitis in the past, also history of hypertension, arthritis, and the MRSA as mentioned.  He is morbidly obese.  He also has had history of chest pain and sleep apnea.  ADMISSION PHYSICAL EXAMINATION:  GENERAL:  Showed a pleasant middle-aged man who is morbidly obese. VITAL SIGNS:  His temperature is 98.6, pulse 133, respiratory rate 20, blood pressure 105/50, O2 sat 99%.  His speech was normal, sentence structure intact.  Sensorium was good.  His affect is good.  His color was good. HEART:  Had a regular rhythm and rate of about 100. LUNGS:  Show decreased breath sounds throughout, but he is moving air well. ABDOMEN:  Soft and obese. EXTREMITIES:  His left lower leg was erythema, slightly swollen.  The right leg was not swollen.  His admission white blood cell count was 17,000, of which 91% were neutrophils, 2 lymphs.  His hemoglobin was 15.6.  Sodium was 131 with a chloride 95, glucose 118, alk phos 125, otherwise CMP was normal.  He did have a venous Doppler on his left lower  leg, which showed no evidence of DVT within the left lower extremity.  ADMISSION DIAGNOSES: 1. Cellulitis of left leg. 2. Personal history of methicillin resistant Staphylococcus aureus     infection. 3. Morbid obesity. 4. Benign essential hypertension. 5. Sleep apnea. 6. Arthritis. He is admitted to the hospital and will be on IV vancomycin.  I will continue his other home meds as noted above.  I discussed this with the patient and family.     Mila Homer. Sudie Bailey, M.D.     SDK/MEDQ  D:  09/09/2011  T:  09/10/2011  Job:  [id number]

## 2011-09-11 LAB — DIFFERENTIAL
Basophils Absolute: 0 10*3/uL (ref 0.0–0.1)
Basophils Relative: 0 % (ref 0–1)
Eosinophils Relative: 1 % (ref 0–5)
Lymphocytes Relative: 9 % — ABNORMAL LOW (ref 12–46)
Monocytes Absolute: 1.3 10*3/uL — ABNORMAL HIGH (ref 0.1–1.0)
Monocytes Relative: 11 % (ref 3–12)

## 2011-09-11 LAB — CBC
HCT: 40.3 % (ref 39.0–52.0)
Hemoglobin: 13 g/dL (ref 13.0–17.0)
MCHC: 32.3 g/dL (ref 30.0–36.0)
MCV: 88.4 fL (ref 78.0–100.0)
RDW: 14.8 % (ref 11.5–15.5)

## 2011-09-11 MED ORDER — ENOXAPARIN SODIUM 100 MG/ML ~~LOC~~ SOLN
90.0000 mg | SUBCUTANEOUS | Status: DC
  Administered 2011-09-11 – 2011-09-15 (×5): 90 mg via SUBCUTANEOUS
  Filled 2011-09-11 (×6): qty 1

## 2011-09-11 NOTE — Progress Notes (Signed)
ANTIBIOTIC CONSULT NOTE   Pharmacy Consult for Vancomycin Indication: cellulitis  Allergies  Allergen Reactions  . Penicillins    Patient Measurements: Height: 6' (182.9 cm) Weight: 384 lb 0.7 oz (174.2 kg) IBW/kg (Calculated) : 77.6   Vital Signs: Temp: 98.2 F (36.8 C) (10/24 0500) Temp src: Oral (10/24 0500) BP: 110/71 mmHg (10/24 0500) Pulse Rate: 70  (10/24 0500) Intake/Output from previous day: 10/23 0701 - 10/24 0700 In: 3808 [P.O.:1080; I.V.:2174; IV Piggyback:554] Out: -  Intake/Output from this shift:    Labs:  [facility] 09/11/11 0440 09/09/11 1239  WBC 11.5* 17.0*  HGB 13.0 15.6  PLT 152 151  LABCREA -- --  CREATININE -- 1.21   Estimated Creatinine Clearance: 125.4 ml/min (by C-G formula based on Cr of 1.21).  Basename 09/11/11 0938  VANCOTROUGH 12.0  VANCOPEAK --  VANCORANDOM --  GENTTROUGH --  GENTPEAK --  GENTRANDOM --  TOBRATROUGH --  TOBRAPEAK --  TOBRARND --  AMIKACINPEAK --  AMIKACINTROU --  AMIKACIN --    Microbiology: Recent Results (from the past 720 hour(s))  MRSA PCR SCREENING     Status: Normal   Collection Time   09/09/11 10:28 PM      Component Value Range Status Comment   MRSA by PCR NEGATIVE  NEGATIVE  Final    Medical History: Past Medical History  Diagnosis Date  . Cellulitis   . Hypertension   . Arthritis   . MRSA (methicillin resistant Staphylococcus aureus)    Medications:  Scheduled:     . antiseptic oral rinse   Mouth Rinse BID  . aspirin  325 mg Oral Daily  . diltiazem  240 mg Oral QHS  . diltiazem  360 mg Oral Daily  . enoxaparin (LOVENOX) injection  90 mg Subcutaneous Q24H  . sodium chloride      . vancomycin  1,500 mg Intravenous Q12H   Assessment: Trough on target  Goal of Therapy:  Vancomycin trough level 10-15 mcg/ml  Plan: Continue current Rx Labs per protocol   Valrie Hart A 09/11/2011,11:38 AM

## 2011-09-11 NOTE — Consult Note (Signed)
ANTICOAGULATION CONSULT NOTE - Initial Consult  Pharmacy Consult for Lovenox Indication: VTE prophylaxis  Allergies  Allergen Reactions  . Penicillins    Patient Measurements: Height: 6' (182.9 cm) Weight: 384 lb 0.7 oz (174.2 kg) IBW/kg (Calculated) : 77.6   Vital Signs: Temp: 98.2 F (36.8 C) (10/24 0500) Temp src: Oral (10/24 0500) BP: 110/71 mmHg (10/24 0500) Pulse Rate: 70  (10/24 0500)  Labs:  Basename 09/11/11 0440 09/09/11 1239  HGB 13.0 15.6  HCT 40.3 46.1  PLT 152 151  APTT -- --  LABPROT -- --  INR -- --  HEPARINUNFRC -- --  CREATININE -- 1.21  CKTOTAL -- --  CKMB -- --  TROPONINI -- --   Estimated Creatinine Clearance: 125.4 ml/min (by C-G formula based on Cr of 1.21).  Medical History: Past Medical History  Diagnosis Date  . Cellulitis   . Hypertension   . Arthritis   . MRSA (methicillin resistant Staphylococcus aureus)    Medications:  Scheduled:    . antiseptic oral rinse   Mouth Rinse BID  . aspirin  325 mg Oral Daily  . diltiazem  240 mg Oral QHS  . diltiazem  360 mg Oral Daily  . enoxaparin (LOVENOX) injection  90 mg Subcutaneous Q24H  . sodium chloride      . vancomycin  1,500 mg Intravenous Q12H   Assessment: Morbid obesity Good renal fxn  Goal of Therapy:  vte prophylaxis   Plan: Lovenox 90mg  sq q24hrs (~0.5mg /kg per dose) Monitor CBC and renal fxn.  Margo Aye, Roselynn Whitacre A 09/11/2011,9:19 AM

## 2011-09-11 NOTE — Progress Notes (Signed)
[name], [name]            ACCOUNT NO.:  [id number]  MEDICAL RECORD NO.:  [id number]  LOCATION:  A314                          FACILITY:  [facility]  PHYSICIAN:  Mila Homer. Sudie Bailey, M.D.DATE OF BIRTH:  October 02, 1966  DATE OF PROCEDURE: DATE OF DISCHARGE:                                PROGRESS NOTE   SUBJECTIVE:  He is generally doing better.  Feels his leg does not hurt as much.  OBJECTIVE:  VITAL SIGNS:  Temperature is 98.2, pulse 70, respiratory 20, blood pressure 110/71, O2 sats 94%. GENERAL:  Sitting up in bed.  He is morbidly obese. HEART:  Regular rhythm rate of 80. LUNGS:  Clear throughout. SKIN:  Color is good. EXTREMITIES:  He still has erythema of the entire left lower leg.  There is also mild swelling and tenderness on palpation.  He also has had some mild erythema in the medial aspect of the left knee and also the medial left upper leg.  ASSESSMENT: 1. Cellulitis of left leg. 2. History of MRSA infection of the arm. 3. Morbid obesity. 4. Hypertension.  PLAN:  Continue Vicodin and vancomycin IV.  Add Lovenox prophylactically.  Hopefully, he will be significantly better in the next several days, and he can be discharged home on doxycycline.     Mila Homer. Sudie Bailey, M.D.     SDK/MEDQ  D:  09/11/2011  T:  09/11/2011  Job:  [id number]

## 2011-09-12 MED ORDER — SODIUM CHLORIDE 0.9 % IJ SOLN
INTRAMUSCULAR | Status: AC
  Administered 2011-09-12: 10 mL
  Filled 2011-09-12: qty 10

## 2011-09-12 NOTE — Progress Notes (Signed)
NAMECELSO, [name]            ACCOUNT NO.:  [id number]  MEDICAL RECORD NO.:  [id number]  LOCATION:  A314                          FACILITY:  [facility]  PHYSICIAN:  Mila Homer. Sudie Bailey, M.D.DATE OF BIRTH:  09/17/1966  DATE OF PROCEDURE: DATE OF DISCHARGE:                                PROGRESS NOTE   SUBJECTIVE:  His legs feel somewhat better today.  OBJECTIVE:  VITAL SIGNS:  Temp is 97.4 pulse 68, respiratory rate 18, blood pressure 133/74.  O2 sats 95%. GENERAL:  His color is good.  He is well-developed and morbidly obese in a semi-recumbent bed. HEART:  Has a regular rhythm, rate of about 80. LUNGS:  Clear throughout and he is moving air well.  He still has erythema of the left leg anteriorly, but much less so between the knee and halfway down the left leg.  He still has erythema, much milder, in the left medial thigh/groin region.  There is minimal edema.  His white blood cell count is 11500, down from 17,000 on admission two days ago.  ASSESSMENT: 1. Cellulitis of the left leg. 2. History of methicillin-resistant Staphylococcus aureus infection. 3. Morbid obesity. 4. Hypertension.  PLAN:  Continue vancomycin IV and when the erythema is cleared from the leg, we will consider discharging home on doxycycline 100 mg b.i.d. Recheck a white cell count tomorrow.     Mila Homer. Sudie Bailey, M.D.     SDK/MEDQ  D:  09/12/2011  T:  09/12/2011  Job:  [id number]

## 2011-09-12 NOTE — Plan of Care (Signed)
Problem: Consults Goal: Skin Care Protocol Initiated - if indicated If consults are not indicated, leave blank or document N/A

## 2011-09-13 LAB — CBC
Hemoglobin: 13.8 g/dL (ref 13.0–17.0)
MCH: 28.6 pg (ref 26.0–34.0)
MCHC: 32.9 g/dL (ref 30.0–36.0)
MCV: 87.1 fL (ref 78.0–100.0)

## 2011-09-13 MED ORDER — SODIUM CHLORIDE 0.9 % IJ SOLN
INTRAMUSCULAR | Status: AC
  Administered 2011-09-13: 3 mL
  Filled 2011-09-13: qty 3

## 2011-09-13 NOTE — Progress Notes (Signed)
[name], John [name]            ACCOUNT NO.:  [id number]  MEDICAL RECORD NO.:  [id number]  LOCATION:  A314                          FACILITY:  [facility]  PHYSICIAN:  Mila Homer. Sudie Bailey, M.D.DATE OF BIRTH:  January 29, 1966  DATE OF PROCEDURE:  09/13/2011 DATE OF DISCHARGE:                                PROGRESS NOTE   SUBJECTIVE:  He feels good deal better.  He still is having pain in the left leg when he stands up, but this is improved.  He reminds me that the left leg is chronically swollen compared to the right leg, but that it is still more swollen now than it usually is.  OBJECTIVE:  VITAL SIGNS:  Temp is 98.2, pulse 57, respiratory 20, blood pressure 146/69, O2 sats 97%.  GENERAL:  He is sitting up in bed.  He is really in no acute distress.  He is morbidly obese and on CPAP when I come into the room in the morning.  He is able to stand up on the legs. There is still significant erythema and swelling of the left leg compared to the right leg.  The erythema extends down from about a 3rd of the way from the knee down all way to the ankle.  ASSESSMENT: 1. Cellulitis of the left leg, improved. 2. History of methicillin-resistant Staphylococcus aureus infection. 3. Morbid obesity. 4. Sleep apnea. 5. Hypertension.  PLAN:  Continue vancomycin at present.  I would like the swelling to be back to normal and erythema improved with no pain on walking before discharging him home.     Mila Homer. Sudie Bailey, M.D.     SDK/MEDQ  D:  09/13/2011  T:  09/13/2011  Job:  [id number]

## 2011-09-13 NOTE — Progress Notes (Signed)
ANTIBIOTIC CONSULT NOTE   Pharmacy Consult for Vancomycin Indication: cellulitis  Allergies  Allergen Reactions  . Penicillins    Patient Measurements: Height: 6' (182.9 cm) Weight: 384 lb 0.7 oz (174.2 kg) IBW/kg (Calculated) : 77.6   Vital Signs: Temp: 98.2 F (36.8 C) (10/26 0700) BP: 146/69 mmHg (10/26 0700) Pulse Rate: 78  (10/26 0937) Intake/Output from previous day: 10/25 0701 - 10/26 0700 In: 180 [P.O.:180] Out: -  Intake/Output from this shift:    Labs:  Basename 09/13/11 0522 09/11/11 0440  WBC 6.4 11.5*  HGB 13.8 13.0  PLT 199 152  LABCREA -- --  CREATININE -- --   Estimated Creatinine Clearance: 125.4 ml/min (by C-G formula based on Cr of 1.21).  Basename 09/11/11 0938  VANCOTROUGH 12.0  VANCOPEAK --  VANCORANDOM --  GENTTROUGH --  GENTPEAK --  GENTRANDOM --  TOBRATROUGH --  TOBRAPEAK --  TOBRARND --  AMIKACINPEAK --  AMIKACINTROU --  AMIKACIN --    Microbiology: Recent Results (from the past 720 hour(s))  MRSA PCR SCREENING     Status: Normal   Collection Time   09/09/11 10:28 PM      Component Value Range Status Comment   MRSA by PCR NEGATIVE  NEGATIVE  Final    Medical History: Past Medical History  Diagnosis Date  . Cellulitis   . Hypertension   . Arthritis   . MRSA (methicillin resistant Staphylococcus aureus)    Medications:  Scheduled:     . antiseptic oral rinse   Mouth Rinse BID  . aspirin  325 mg Oral Daily  . diltiazem  240 mg Oral QHS  . diltiazem  360 mg Oral Daily  . enoxaparin (LOVENOX) injection  90 mg Subcutaneous Q24H  . sodium chloride      . vancomycin  1,500 mg Intravenous Q12H   Assessment: Trough on target  Goal of Therapy:  Vancomycin trough level 10-15 mcg/ml  Plan: Continue current Rx Labs per protocol   Valrie Hart A 09/13/2011,11:12 AM

## 2011-09-14 LAB — CBC
MCH: 28.6 pg (ref 26.0–34.0)
MCHC: 32.5 g/dL (ref 30.0–36.0)
Platelets: 225 10*3/uL (ref 150–400)
RDW: 14.4 % (ref 11.5–15.5)

## 2011-09-14 LAB — BASIC METABOLIC PANEL
CO2: 27 mEq/L (ref 19–32)
Calcium: 8.5 mg/dL (ref 8.4–10.5)
Creatinine, Ser: 1.02 mg/dL (ref 0.50–1.35)
GFR calc non Af Amer: 86 mL/min — ABNORMAL LOW (ref >90)
Glucose, Bld: 111 mg/dL — ABNORMAL HIGH (ref 70–99)
Sodium: 135 mEq/L (ref 135–145)

## 2011-09-14 LAB — DIFFERENTIAL
Basophils Absolute: 0 10*3/uL (ref 0.0–0.1)
Basophils Relative: 0 % (ref 0–1)
Eosinophils Absolute: 0.3 10*3/uL (ref 0.0–0.7)
Eosinophils Relative: 4 % (ref 0–5)
Monocytes Absolute: 1 10*3/uL (ref 0.1–1.0)
Monocytes Relative: 12 % (ref 3–12)
Neutro Abs: 5.5 10*3/uL (ref 1.7–7.7)

## 2011-09-14 LAB — MAGNESIUM: Magnesium: 2.2 mg/dL (ref 1.5–2.5)

## 2011-09-14 MED ORDER — SODIUM CHLORIDE 0.9 % IJ SOLN
INTRAMUSCULAR | Status: AC
  Administered 2011-09-14: 3 mL
  Filled 2011-09-14: qty 3

## 2011-09-14 MED ORDER — SODIUM CHLORIDE 0.9 % IJ SOLN
INTRAMUSCULAR | Status: AC
  Administered 2011-09-15: 10 mL
  Filled 2011-09-14: qty 3

## 2011-09-14 NOTE — Progress Notes (Signed)
[id number] 

## 2011-09-14 NOTE — Progress Notes (Signed)
NAMESRIHARI, SHELLHAMMER NO.:  [id number]  MEDICAL RECORD NO.:  [id number]  LOCATION:  A314                          FACILITY:  [facility]  PHYSICIAN:  Melvyn Novas, MDDATE OF BIRTH:  08/08/1966  DATE OF PROCEDURE: DATE OF DISCHARGE:                                PROGRESS NOTE   The patient admitted with chronic cellulitis acute on chronic left leg, diffuse erythema, tenderness, venous Dopplers in the ER reported negative for DVT, he has hypertension, obesity, and venous insufficiency, which is chronic.  WBC 6.4, hemoglobin 13.8, potassium 3.9, BUN 8, and creatinine 1.02, currently on vancomycin.  Alert and oriented.  Lungs clear.  No rales, wheeze, or rhonchi.  Heart regular rhythm.  No murmurs, gallops, or rubs.  Abdomen no detectable organomegaly.  Left leg diffuse tenderness, erythema.  Peripheral pulse 1+ bilaterally.  Plan right now is to continue diltiazem 360 daily.  Continue Lovenox and IV vancomycin, Dilaudid IV for pain, and we will check BMET every other day per pharmacy protocol and CBC on Monday.     Melvyn Novas, MD     RMD/MEDQ  D:  09/14/2011  T:  09/14/2011  Job:  [id number]

## 2011-09-15 LAB — TSH: TSH: 1.467 u[IU]/mL (ref 0.350–4.500)

## 2011-09-15 MED ORDER — VANCOMYCIN HCL 500 MG IV SOLR
INTRAVENOUS | Status: AC
  Filled 2011-09-15: qty 500

## 2011-09-15 MED ORDER — VANCOMYCIN HCL IN DEXTROSE 1-5 GM/200ML-% IV SOLN
INTRAVENOUS | Status: AC
  Filled 2011-09-15: qty 200

## 2011-09-15 NOTE — Progress Notes (Signed)
NAMEMANFRED, LASPINA NO.:  [id number]  MEDICAL RECORD NO.:  [id number]  LOCATION:  A314                          FACILITY:  [facility]  PHYSICIAN:  Melvyn Novas, MDDATE OF BIRTH:  [birth year]  DATE OF PROCEDURE: DATE OF DISCHARGE:                                PROGRESS NOTE   The patient has left lower extremity cellulitis, was quite erythematous and tender yesterday, seems to be less erythematous and less tender today on IV vancomycin.  Blood pressure 113/60, temperature 98, 3 pulse 84 and regular, respiratory rate is 20, O2 saturation 97%.  WBC is at 8.2, hemoglobin 13.6, creatinine 1.02, currently on IV vancomycin.  The patient feels comfortable.  His hypertension is well controlled on diltiazem.  He is currently on Lovenox.  Plan right now is to continue antibiotic therapy.  The patient discussed possibly p.o. antibiotics and going home tomorrow.  Dr. Sudie Bailey will decide upon that.  His lungs are clear.  His heart is regular rhythm. No S3, S4, or gallops.  Hemodynamically stable.  Continue vancomycin and consider oral antibiotics as per primary doctor.     Melvyn Novas, MD     RMD/MEDQ  D:  09/15/2011  T:  09/15/2011  Job:  [id number]

## 2011-09-15 NOTE — Progress Notes (Signed)
[id number] 

## 2011-09-16 LAB — BASIC METABOLIC PANEL
BUN: 10 mg/dL (ref 6–23)
Calcium: 8.8 mg/dL (ref 8.4–10.5)
Chloride: 101 mEq/L (ref 96–112)
Creatinine, Ser: 0.94 mg/dL (ref 0.50–1.35)
GFR calc Af Amer: >90 mL/min (ref >90)

## 2011-09-16 MED ORDER — DOXYCYCLINE HYCLATE 50 MG PO CAPS: 100.0000 mg | ORAL_CAPSULE | Freq: Two times a day (BID) | ORAL | Status: AC

## 2011-09-16 NOTE — Progress Notes (Signed)
Patient d/c home Left floor via walking accompanied by staff No c/o pain at d/c Verbalized understanding of d/c instructions, follow appts, and rx's Donyea Beverlin, [employer]

## 2011-09-16 NOTE — Discharge Summary (Signed)
John [name], John [name]            ACCOUNT NO.:  [id number]  MEDICAL RECORD NO.:  [id number]  LOCATION:  A314                          FACILITY:  [facility]  PHYSICIAN:  Mila Homer. Sudie Bailey, M.D.DATE OF BIRTH:  [birth year]  DATE OF ADMISSION:  09/09/2011 DATE OF DISCHARGE:  10/29/2012LH                              DISCHARGE SUMMARY   HOSPITAL COURSE:  This 45 year old was admitted to the hospital with a severe cellulitis of his left leg.  He had a benign 8-day hospitalization extending from September 09, 2011, to September 16, 2011. Vital signs remained stable.  His admission white cell count was 17,000 with a shift to the left. Recheck in 2 days was 11,500 and then dropped to 6400.  His admission hemoglobin was 15.6, and it dropped to 13 and then stayed stable at 13.8, indicating some mild dehydration.  He had admission sodium 131, chloride 95, BUN 15 and creatinine 1.21 and was changed to 8 and 1.02 after rehydration.  MRSA by PCR to his nares was negative.  Glucoses were in the low 100- range.  TSH was 1.467.  Ultrasound of his left leg showed no DVT.  He was admitted to the hospital.  Given his history of MRSA infection of the right arm, he was put on vancomycin IV.  He was also continued on diltiazem CD 360 mg q.a.m. and 240 mg at bedtime with aspirin 325 mg daily, p.r.n. loratadine, hydromorphone, furosemide, cyclobenzaprine, and acetaminophen.  We used enoxaparin 90 mg daily for DVT prophylaxis.  He gradually improved on this regimen.  The left leg redness gradually improved although was still present on discharge.  He still had edema of the left leg but he does have chronic edema of the left leg.  He had a tiny bit of pain when he got up and walked, but that was much improved compared to his admission.  DISCHARGE MEDICATIONS:  He was discharged home on doxycycline 100 mg b.i.d. (30 with no refills).  He is to continue his other medications including: 1. Aspirin 325 mg  daily. 2. Cetirizine 10 mg daily p.r.n. allergy. 3. Clobetasol cream. 4. Cyanocobalamin 1000 mcg daily. 5. Cyclobenzaprine 10 mg t.i.d. p.r.n. muscle spasm. 6. Diltiazem 360 mg q.a.m. 7. Diltiazem 240 mg at bedtime. 8. Furosemide 40 mg b.i.d. p.r.n. swelling. 9. Hydrocodone/acetaminophen 5/325 every 6 for pain. 10.KCl 20 mEq daily.  FOLLOW UP:  Follow up was to be in the office in 4 days or before then if the leg was getting worse.  Discharge planning talked to him before discharge.  FINAL DISCHARGE DIAGNOSES: 1. Cellulitis of left leg. 2. History of methicillin-resistant Staphylococcus aureus infection. 3. Morbid obesity. 4. Benign essential hypertension. 5. Sleep apnea. 6. Hyperglycemia.     Mila Homer. Sudie Bailey, M.D.     SDK/MEDQ  D:  09/16/2011  T:  09/16/2011  Job:  [id number]

## 2011-09-16 NOTE — Progress Notes (Signed)
ANTIBIOTIC CONSULT NOTE   Pharmacy Consult for Vancomycin Indication: cellulitis  Allergies  Allergen Reactions  . Penicillins    Patient Measurements: Height: 6' (182.9 cm) Weight: 384 lb 0.7 oz (174.2 kg) IBW/kg (Calculated) : 77.6   Vital Signs: Temp: 97.5 F (36.4 C) (10/29 0430) Temp src: Oral (10/29 0430) BP: 99/63 mmHg (10/29 0430) Pulse Rate: 63  (10/29 0430) Intake/Output from previous day: 10/28 0701 - 10/29 0700 In: 1590 [P.O.:1080; I.V.:10; IV Piggyback:500] Out: 3 [Urine:2; Stool:1] Intake/Output from this shift:    Labs:  Basename 09/16/11 0503 09/14/11 0639  WBC -- 8.2  HGB -- 13.6  PLT -- 225  LABCREA -- --  CREATININE 0.94 1.02   Estimated Creatinine Clearance: 161.4 ml/min (by C-G formula based on Cr of 0.94).   Microbiology: Recent Results (from the past 720 hour(s))  MRSA PCR SCREENING     Status: Normal   Collection Time   09/09/11 10:28 PM      Component Value Range Status Comment   MRSA by PCR NEGATIVE  NEGATIVE  Final    Medical History: Past Medical History  Diagnosis Date  . Cellulitis   . Hypertension   . Arthritis   . MRSA (methicillin resistant Staphylococcus aureus)    Medications:  Scheduled:     . antiseptic oral rinse   Mouth Rinse BID  . aspirin  325 mg Oral Daily  . diltiazem  240 mg Oral QHS  . diltiazem  360 mg Oral Daily  . enoxaparin (LOVENOX) injection  90 mg Subcutaneous Q24H  . vancomycin  1,500 mg Intravenous Q12H   Assessment: Trough on target  Goal of Therapy:  Vancomycin trough level 10-15 mcg/ml  Plan: Continue current Rx Labs per protocol   John [name] J 09/16/2011,9:15 AM

## 2012-05-21 ENCOUNTER — Emergency Department (HOSPITAL_COMMUNITY)
Admission: EM | Admit: 2012-05-21 | Discharge: 2012-05-21 | Disposition: A | Discharge status: Home / Self Care | Payer: Medicare Other | Attending: Emergency Medicine | Admitting: Emergency Medicine

## 2012-05-21 ENCOUNTER — Encounter (HOSPITAL_COMMUNITY): Payer: Self-pay | Admitting: Emergency Medicine

## 2012-05-21 DIAGNOSIS — I10 Essential (primary) hypertension: Secondary | ICD-10-CM | POA: Insufficient documentation

## 2012-05-21 DIAGNOSIS — M791 Myalgia, unspecified site: Secondary | ICD-10-CM

## 2012-05-21 DIAGNOSIS — Z7982 Long term (current) use of aspirin: Secondary | ICD-10-CM | POA: Insufficient documentation

## 2012-05-21 DIAGNOSIS — Z8614 Personal history of Methicillin resistant Staphylococcus aureus infection: Secondary | ICD-10-CM | POA: Insufficient documentation

## 2012-05-21 DIAGNOSIS — M542 Cervicalgia: Secondary | ICD-10-CM

## 2012-05-21 DIAGNOSIS — M25519 Pain in unspecified shoulder: Secondary | ICD-10-CM | POA: Insufficient documentation

## 2012-05-21 DIAGNOSIS — IMO0001 Reserved for inherently not codable concepts without codable children: Secondary | ICD-10-CM | POA: Insufficient documentation

## 2012-05-21 DIAGNOSIS — R51 Headache: Secondary | ICD-10-CM | POA: Insufficient documentation

## 2012-05-21 DIAGNOSIS — Z880 Allergy status to penicillin: Secondary | ICD-10-CM | POA: Insufficient documentation

## 2012-05-21 DIAGNOSIS — Z79899 Other long term (current) drug therapy: Secondary | ICD-10-CM | POA: Insufficient documentation

## 2012-05-21 LAB — CBC
HCT: 43.6 % (ref 39.0–52.0)
MCHC: 33.7 g/dL (ref 30.0–36.0)
MCV: 85.7 fL (ref 78.0–100.0)
RDW: 13.8 % (ref 11.5–15.5)

## 2012-05-21 LAB — BASIC METABOLIC PANEL
BUN: 9 mg/dL (ref 6–23)
Chloride: 99 mEq/L (ref 96–112)
Creatinine, Ser: 1.01 mg/dL (ref 0.50–1.35)
GFR calc Af Amer: >90 mL/min (ref >90)

## 2012-05-21 MED ORDER — KETOROLAC TROMETHAMINE 30 MG/ML IJ SOLN
30.0000 mg | Freq: Once | INTRAMUSCULAR | Status: AC
  Administered 2012-05-21: 30 mg via INTRAVENOUS
  Filled 2012-05-21: qty 1

## 2012-05-21 MED ORDER — HYDROCODONE-ACETAMINOPHEN 5-325 MG PO TABS: 1.0000 | ORAL_TABLET | ORAL | Status: AC | PRN

## 2012-05-21 MED ORDER — HYDROMORPHONE HCL PF 1 MG/ML IJ SOLN
1.0000 mg | Freq: Once | INTRAMUSCULAR | Status: AC
  Administered 2012-05-21: 1 mg via INTRAVENOUS
  Filled 2012-05-21: qty 1

## 2012-05-21 MED ORDER — SODIUM CHLORIDE 0.9 % IV BOLUS (SEPSIS)
1000.0000 mL | INTRAVENOUS | Status: AC
  Administered 2012-05-21: 1000 mL via INTRAVENOUS

## 2012-05-21 NOTE — ED Notes (Signed)
Patient c/o headache, generalized body aches and states he feels hot.

## 2012-05-21 NOTE — ED Provider Notes (Signed)
History     CSN: [id number]  Arrival date & time 05/21/12  0033   First MD Initiated Contact with Patient 05/21/12 [phone]      Chief Complaint  Patient presents with  . Headache  . Generalized Body Aches    (Consider location/radiation/quality/duration/timing/severity/associated sxs/prior treatment) HPI  John [name] is a 46 y.o. malewith a h/o morbid obesity, recurrent cellulitis, hypertension, who presents to the Emergency Department complaining of headache, neck ache, right shoulder pain, and generalized body aches and pains that began this evening. These were associated with him feeling "hot" but he did not take his temperature. He did take 2 aspirin.  PCP Dr. Sudie Bailey  Past Medical History  Diagnosis Date  . Cellulitis   . Hypertension   . Arthritis   . MRSA (methicillin resistant Staphylococcus aureus)     Past Surgical History  Procedure Date  . Mouth surgery   . Knee arthroscopy   . Shoulder surgery   . Appendectomy     No family history on file.  History  Substance Use Topics  . Smoking status: Never Smoker   . Smokeless tobacco: Never Used  . Alcohol Use: Yes     occasionally      Review of Systems  Constitutional: Negative for fever.       10 Systems reviewed and are negative for acute change except as noted in the HPI.  HENT: Negative for congestion.   Eyes: Negative for discharge and redness.  Respiratory: Negative for cough and shortness of breath.   Cardiovascular: Negative for chest pain.  Gastrointestinal: Negative for vomiting and abdominal pain.  Musculoskeletal: Positive for myalgias. Negative for back pain.       Right shoulder pain, neck pain  Skin: Negative for rash.  Neurological: Positive for headaches. Negative for syncope and numbness.  Psychiatric/Behavioral:       No behavior change.    Allergies  Penicillins  Home Medications   Current Outpatient Rx  Name Route Sig Dispense Refill  . ASPIRIN 325 MG PO TABS Oral  Take 325 mg by mouth daily.      Marland Kitchen CETIRIZINE HCL 10 MG PO TABS Oral Take 10 mg by mouth daily as needed. allergies     . CLOBETASOL PROPIONATE 0.05 % EX CREA Topical Apply 1 application topically 2 (two) times daily. cellulitis     . VITAMIN B 12 PO Oral Take 1 tablet by mouth daily.      . CYCLOBENZAPRINE HCL 10 MG PO TABS Oral Take 10 mg by mouth 3 (three) times daily as needed. Muscle spasms     . DILTIAZEM HCL ER COATED BEADS 360 MG PO CP24 Oral Take 360 mg by mouth daily.      Marland Kitchen DILTIAZEM HCL ER 240 MG PO CP24 Oral Take 240 mg by mouth at bedtime.      . FUROSEMIDE 40 MG PO TABS Oral Take 40 mg by mouth 2 (two) times daily as needed. Swelling, takes along with potassium     . HYDROCODONE-ACETAMINOPHEN 5-325 MG PO TABS Oral Take 1 tablet by mouth every 6 (six) hours as needed. For pain     . POTASSIUM CHLORIDE 20 MEQ PO PACK Oral Take 20 mEq by mouth 2 (two) times daily as needed. Along with fluid       BP 123/77  Pulse 101  Temp 98.3 F (36.8 C) (Oral)  Resp 20  Ht 5\' 11"  (1.803 m)  Wt 383 lb (173.728 kg)  BMI 53.42  kg/m2  SpO2 94%  Physical Exam  Nursing note and vitals reviewed. Constitutional: He is oriented to person, place, and time. He appears well-developed and well-nourished.       Awake, alert, nontoxic appearance.  HENT:  Head: Normocephalic and atraumatic.  Right Ear: External ear normal.  Left Ear: External ear normal.  Mouth/Throat: Oropharynx is clear and moist.  Eyes: Conjunctivae and EOM are normal. Pupils are equal, round, and reactive to light. Right eye exhibits no discharge. Left eye exhibits no discharge.  Neck: Normal range of motion. Neck supple.  Pulmonary/Chest: Effort normal and breath sounds normal. He exhibits no tenderness.  Abdominal: Soft. Bowel sounds are normal. There is no tenderness. There is no rebound.  Musculoskeletal: Normal range of motion. He exhibits no tenderness.       Baseline ROM, no obvious new focal weakness.  Neurological: He  is alert and oriented to person, place, and time.       Mental status and motor strength appears baseline for patient and situation.  Skin: No rash noted.  Psychiatric: He has a normal mood and affect.    ED Course  Procedures (including critical care time)  Results for orders placed during the hospital encounter of 05/21/12  CBC      Component Value Range   WBC 6.4  4.0 - 10.5 K/uL   RBC 5.09  4.22 - 5.81 MIL/uL   Hemoglobin 14.7  13.0 - 17.0 g/dL   HCT 16.1  09.6 - 04.5 %   MCV 85.7  78.0 - 100.0 fL   MCH 28.9  26.0 - 34.0 pg   MCHC 33.7  30.0 - 36.0 g/dL   RDW 40.9  81.1 - 91.4 %   Platelets 157  150 - 400 K/uL  BASIC METABOLIC PANEL      Component Value Range   Sodium 135  135 - 145 mEq/L   Potassium 3.4 (*) 3.5 - 5.1 mEq/L   Chloride 99  96 - 112 mEq/L   CO2 26  19 - 32 mEq/L   Glucose, Bld 118 (*) 70 - 99 mg/dL   BUN 9  6 - 23 mg/dL   Creatinine, Ser 7.82  0.50 - 1.35 mg/dL   Calcium 9.1  8.4 - 95.6 mg/dL   GFR calc non Af Amer 87 (*) >90 mL/min   GFR calc Af Amer >90  >90 mL/min     MDM  Patient with headache, myalgias, shoulder and neck pain without recent strenuous activity. Given IVF, analgesic and antiemetic with improvement in his pain. Labs unremarkable. Pt stable in ED with no significant deterioration in condition.The patient appears reasonably screened and/or stabilized for discharge and I doubt any other medical condition or other [facility] requiring further screening, evaluation, or treatment in the ED at this time prior to discharge.  MDM Reviewed: nursing note and vitals Interpretation: labs           Nicoletta Dress. Colon Branch, MD 05/21/12 [phone]

## 2012-05-21 NOTE — ED Notes (Signed)
Patient also c/o scratchy throat and his mouth is dry.

## 2012-05-22 ENCOUNTER — Emergency Department (HOSPITAL_COMMUNITY)
Admission: EM | Admit: 2012-05-22 | Discharge: 2012-05-22 | Disposition: A | Discharge status: Home / Self Care | Payer: Medicare Other | Attending: Emergency Medicine | Admitting: Emergency Medicine

## 2012-05-22 ENCOUNTER — Emergency Department (HOSPITAL_COMMUNITY): Payer: Medicare Other

## 2012-05-22 ENCOUNTER — Encounter (HOSPITAL_COMMUNITY): Payer: Self-pay | Admitting: *Deleted

## 2012-05-22 DIAGNOSIS — J3489 Other specified disorders of nose and nasal sinuses: Secondary | ICD-10-CM | POA: Insufficient documentation

## 2012-05-22 DIAGNOSIS — Z8614 Personal history of Methicillin resistant Staphylococcus aureus infection: Secondary | ICD-10-CM | POA: Insufficient documentation

## 2012-05-22 DIAGNOSIS — W57XXXA Bitten or stung by nonvenomous insect and other nonvenomous arthropods, initial encounter: Secondary | ICD-10-CM

## 2012-05-22 DIAGNOSIS — J029 Acute pharyngitis, unspecified: Secondary | ICD-10-CM

## 2012-05-22 DIAGNOSIS — I10 Essential (primary) hypertension: Secondary | ICD-10-CM | POA: Insufficient documentation

## 2012-05-22 DIAGNOSIS — M129 Arthropathy, unspecified: Secondary | ICD-10-CM | POA: Insufficient documentation

## 2012-05-22 DIAGNOSIS — R509 Fever, unspecified: Secondary | ICD-10-CM | POA: Insufficient documentation

## 2012-05-22 DIAGNOSIS — R070 Pain in throat: Secondary | ICD-10-CM | POA: Insufficient documentation

## 2012-05-22 MED ORDER — DOXYCYCLINE HYCLATE 100 MG PO CAPS: 100.0000 mg | ORAL_CAPSULE | Freq: Two times a day (BID) | ORAL | Status: AC

## 2012-05-22 MED ORDER — ONDANSETRON 8 MG PO TBDP
8.0000 mg | ORAL_TABLET | Freq: Once | ORAL | Status: AC
  Administered 2012-05-22: 8 mg via ORAL
  Filled 2012-05-22: qty 1

## 2012-05-22 MED ORDER — PROMETHAZINE HCL 25 MG PO TABS: 25.0000 mg | ORAL_TABLET | Freq: Four times a day (QID) | ORAL | Status: DC | PRN

## 2012-05-22 NOTE — ED Notes (Signed)
Pt c/o left side rib pain, headache and sore throat x3 days, has been seen in er for same and treated.  Also has family waiting to be seen in lobby

## 2012-05-22 NOTE — ED Provider Notes (Signed)
History     CSN: [id number]  Arrival date & time 05/22/12  1732   First MD Initiated Contact with Patient 05/22/12 1745      Chief Complaint  Patient presents with  . Sore Throat  . Headache    (Consider location/radiation/quality/duration/timing/severity/associated sxs/prior treatment) HPI Comments: Patient complains of body aches, right shoulder pain, frontal headache, sore throat, and left rib pain for 2-3 days. He was seen in this emergency department yesterday for similar symptoms and states the pain medication he received is not helping.Marland Kitchen He states that his throat is hurting worse and he is now having pain to his left rib and intermittent nausea. He states that his fever was 101 earlier today before taking 2 aspirins. He also reports removing a tick from his leg approximately 2 weeks ago.  He denies chest pain, shortness of breath, vomiting, rash or abdominal pain. He also denies any arthralgias except for pain to the right shoulder. Shoulder pain is worse with movement of his arm and improves with rest  Patient is a 46 y.o. male presenting with pharyngitis and headaches. The history is provided by the patient.  Sore Throat This is a new problem. The current episode started in the past 7 days. The problem occurs constantly. The problem has been unchanged. Associated symptoms include chills, congestion, coughing, a fever, headaches, joint swelling, myalgias, nausea, neck pain and a sore throat. Pertinent negatives include no abdominal pain, anorexia, arthralgias, chest pain, diaphoresis, numbness, rash, swollen glands, vertigo, visual change, vomiting or weakness. Nothing aggravates the symptoms. Treatments tried: oral narcotics. The treatment provided mild relief.  Headache  Associated symptoms include a fever and nausea. Pertinent negatives include no anorexia, no palpitations, no shortness of breath and no vomiting.    Past Medical History  Diagnosis Date  . Cellulitis   .  Hypertension   . Arthritis   . MRSA (methicillin resistant Staphylococcus aureus)     Past Surgical History  Procedure Date  . Mouth surgery   . Knee arthroscopy   . Shoulder surgery   . Appendectomy     No family history on file.  History  Substance Use Topics  . Smoking status: Never Smoker   . Smokeless tobacco: Never Used  . Alcohol Use: Yes     occasionally      Review of Systems  Constitutional: Positive for fever and chills. Negative for diaphoresis, activity change and appetite change.  HENT: Positive for congestion, sore throat and neck pain. Negative for facial swelling, trouble swallowing and neck stiffness.   Eyes: Negative for visual disturbance.  Respiratory: Positive for cough. Negative for chest tightness, shortness of breath, wheezing and stridor.   Cardiovascular: Negative for chest pain, palpitations and leg swelling.  Gastrointestinal: Positive for nausea. Negative for vomiting, abdominal pain and anorexia.  Genitourinary: Negative for dysuria and difficulty urinating.  Musculoskeletal: Positive for myalgias and joint swelling. Negative for arthralgias.  Skin: Negative for color change, rash and wound.  Neurological: Positive for headaches. Negative for dizziness, vertigo, facial asymmetry, weakness and numbness.  All other systems reviewed and are negative.    Allergies  Penicillins  Home Medications   Current Outpatient Rx  Name Route Sig Dispense Refill  . ASPIRIN 325 MG PO TABS Oral Take 325 mg by mouth daily.      Marland Kitchen CETIRIZINE HCL 10 MG PO TABS Oral Take 10 mg by mouth daily as needed. allergies     . CLOBETASOL PROPIONATE 0.05 % EX CREA Topical Apply  1 application topically 2 (two) times daily. cellulitis     . VITAMIN B 12 PO Oral Take 1 tablet by mouth daily.      . CYCLOBENZAPRINE HCL 10 MG PO TABS Oral Take 10 mg by mouth 3 (three) times daily as needed. Muscle spasms     . DILTIAZEM HCL ER COATED BEADS 360 MG PO CP24 Oral Take 360 mg  by mouth daily.      Marland Kitchen DILTIAZEM HCL ER 240 MG PO CP24 Oral Take 240 mg by mouth at bedtime.      . FUROSEMIDE 40 MG PO TABS Oral Take 40 mg by mouth 2 (two) times daily as needed. Swelling, takes along with potassium     . HYDROCODONE-ACETAMINOPHEN 5-325 MG PO TABS Oral Take 1 tablet by mouth every 6 (six) hours as needed. For pain     . HYDROCODONE-ACETAMINOPHEN 5-325 MG PO TABS Oral Take 1 tablet by mouth every 4 (four) hours as needed for pain. 10 tablet 0  . POTASSIUM CHLORIDE 20 MEQ PO PACK Oral Take 20 mEq by mouth 2 (two) times daily as needed. Along with fluid       BP 138/101  Pulse 104  Temp 98.6 F (37 C) (Oral)  Resp 18  Ht 5\' 11"  (1.803 m)  Wt 392 lb (177.81 kg)  BMI 54.67 kg/m2  SpO2 96%  Physical Exam  Nursing note and vitals reviewed. Constitutional: He is oriented to person, place, and time. He appears well-developed and well-nourished. No distress.       Morbidly obese  HENT:  Head: Normocephalic and atraumatic. No trismus in the jaw.  Right Ear: Tympanic membrane and ear canal normal.  Left Ear: Tympanic membrane and ear canal normal.  Nose: Mucosal edema and rhinorrhea present.  Mouth/Throat: Uvula is midline and mucous membranes are normal. No uvula swelling. Posterior oropharyngeal erythema present. No oropharyngeal exudate, posterior oropharyngeal edema or tonsillar abscesses.  Eyes: Conjunctivae and EOM are normal. Pupils are equal, round, and reactive to light.  Neck: Normal range of motion and phonation normal. Neck supple. Muscular tenderness present. No spinous process tenderness present. No Brudzinski's sign and no Kernig's sign noted.    Cardiovascular: Normal rate, regular rhythm, normal heart sounds and intact distal pulses.   No murmur heard. Pulmonary/Chest: Effort normal and breath sounds normal. No respiratory distress. He has no decreased breath sounds. He has no wheezes. He has no rhonchi. He has no rales. Chest wall is not dull to percussion.  He exhibits tenderness. He exhibits no mass, no laceration, no crepitus, no edema, no deformity and no swelling.    Abdominal: Soft. Bowel sounds are normal. He exhibits no distension and no mass. There is no tenderness. There is no rebound and no guarding.  Musculoskeletal: He exhibits no edema and no tenderness.  Lymphadenopathy:    He has no cervical adenopathy.  Neurological: He is alert and oriented to person, place, and time. He has normal strength. No cranial nerve deficit or sensory deficit. He exhibits normal muscle tone. Coordination and gait normal.  Skin: Skin is warm and dry. No rash noted.    ED Course  Procedures (including critical care time)   Dg Ribs Unilateral W/chest Left  05/22/2012  *RADIOLOGY REPORT*  Clinical Data: Left rib pain.  Recent cough.  No  LEFT RIBS AND CHEST - 3+ VIEW  Comparison: No prior left rib imaging.  Two-view chest x-ray 03/28/2010, 08/17/2008.  Findings: No left rib fractures identified.  No intrinsic  osseous abnormalities involving the left ribs.  Cardiac silhouette upper normal in size to slightly enlarged but stable.  Hilar and mediastinal contours otherwise unremarkable. Lungs clear.  Bronchovascular markings normal.  Pulmonary vascularity normal.  No pneumothorax.  No pleural effusions.  IMPRESSION:  1.  No left rib fractures identified. 2.  Stable borderline to mild cardiomegaly.  No acute cardiopulmonary disease.  Original Report Authenticated By: Arnell Sieving, M.D.      MDM    Patient seen here yesterday morning with similar symptoms. Patient is nontoxic appearing. No meningeal signs. No focal neuro deficits on exam.   Has reproducible pain to his right shoulder and right cervical paraspinal muscles. Ambulates with a steady gait. Reports nasal congestion, sore throat and intermittent fever at home.  Patient has reported a tick bite recently and with hx of myalgias and arthralgias I will treat with doxy.  Pt agrees to close f/u with his  PMD or to return if his sx's worsen   Patient / Family / Caregiver understand and agree with initial ED impression and plan with expectations set for ED visit. Pt stable in ED with no significant deterioration in condition. Pt feels improved after observation and/or treatment in ED.    The patient appears reasonably screened and/or stabilized for discharge and I doubt any other medical condition or other [facility] requiring further screening, evaluation, or treatment in the ED at this time prior to discharge.    Penny Frisbie L. [place], [place] 05/26/12 2206

## 2012-05-22 NOTE — ED Notes (Signed)
Pt given ginger ale to drink, was also c/o nausea -meds given as ordered by pa.

## 2012-05-22 NOTE — ED Notes (Signed)
Sore throat, headache and rib pain x 2 days.  Seen here for same 2 days ago.

## 2012-05-27 NOTE — ED Provider Notes (Signed)
Medical screening examination/treatment/procedure(s) were performed by non-physician practitioner and as supervising physician I was immediately available for consultation/collaboration.   Laray Anger, DO 05/27/12 1250

## 2014-02-16 ENCOUNTER — Emergency Department (HOSPITAL_COMMUNITY)
Admission: EM | Admit: 2014-02-16 | Discharge: 2014-02-16 | Disposition: A | Discharge status: Home / Self Care | Payer: Medicare Other | Attending: Emergency Medicine | Admitting: Emergency Medicine

## 2014-02-16 ENCOUNTER — Encounter (HOSPITAL_COMMUNITY): Payer: Self-pay | Admitting: Emergency Medicine

## 2014-02-16 ENCOUNTER — Emergency Department (HOSPITAL_COMMUNITY): Payer: Medicare Other

## 2014-02-16 DIAGNOSIS — N200 Calculus of kidney: Secondary | ICD-10-CM

## 2014-02-16 DIAGNOSIS — M129 Arthropathy, unspecified: Secondary | ICD-10-CM | POA: Insufficient documentation

## 2014-02-16 DIAGNOSIS — Z872 Personal history of diseases of the skin and subcutaneous tissue: Secondary | ICD-10-CM | POA: Insufficient documentation

## 2014-02-16 DIAGNOSIS — Z9089 Acquired absence of other organs: Secondary | ICD-10-CM | POA: Insufficient documentation

## 2014-02-16 DIAGNOSIS — Z880 Allergy status to penicillin: Secondary | ICD-10-CM | POA: Insufficient documentation

## 2014-02-16 DIAGNOSIS — Z8614 Personal history of Methicillin resistant Staphylococcus aureus infection: Secondary | ICD-10-CM | POA: Insufficient documentation

## 2014-02-16 DIAGNOSIS — I10 Essential (primary) hypertension: Secondary | ICD-10-CM | POA: Insufficient documentation

## 2014-02-16 DIAGNOSIS — Z7982 Long term (current) use of aspirin: Secondary | ICD-10-CM | POA: Insufficient documentation

## 2014-02-16 DIAGNOSIS — IMO0002 Reserved for concepts with insufficient information to code with codable children: Secondary | ICD-10-CM | POA: Insufficient documentation

## 2014-02-16 DIAGNOSIS — Z79899 Other long term (current) drug therapy: Secondary | ICD-10-CM | POA: Insufficient documentation

## 2014-02-16 LAB — COMPREHENSIVE METABOLIC PANEL
ALK PHOS: 104 U/L (ref 39–117)
ALT: 13 U/L (ref 0–53)
AST: 20 U/L (ref 0–37)
Albumin: 3.4 g/dL — ABNORMAL LOW (ref 3.5–5.2)
BILIRUBIN TOTAL: 0.7 mg/dL (ref 0.3–1.2)
BUN: 14 mg/dL (ref 6–23)
CO2: 29 meq/L (ref 19–32)
Calcium: 8.9 mg/dL (ref 8.4–10.5)
Chloride: 101 mEq/L (ref 96–112)
Creatinine, Ser: 1.14 mg/dL (ref 0.50–1.35)
GFR, EST AFRICAN AMERICAN: 86 mL/min — AB (ref >90)
GFR, EST NON AFRICAN AMERICAN: 74 mL/min — AB (ref >90)
GLUCOSE: 128 mg/dL — AB (ref 70–99)
POTASSIUM: 4 meq/L (ref 3.7–5.3)
SODIUM: 139 meq/L (ref 137–147)
Total Protein: 6.7 g/dL (ref 6.0–8.3)

## 2014-02-16 LAB — CBC WITH DIFFERENTIAL/PLATELET
Basophils Absolute: 0 10*3/uL (ref 0.0–0.1)
Basophils Relative: 0 % (ref 0–1)
Eosinophils Absolute: 0 10*3/uL (ref 0.0–0.7)
Eosinophils Relative: 1 % (ref 0–5)
HCT: 43.5 % (ref 39.0–52.0)
HEMOGLOBIN: 14.6 g/dL (ref 13.0–17.0)
LYMPHS ABS: 0.9 10*3/uL (ref 0.7–4.0)
LYMPHS PCT: 12 % (ref 12–46)
MCH: 28.9 pg (ref 26.0–34.0)
MCHC: 33.6 g/dL (ref 30.0–36.0)
MCV: 86.1 fL (ref 78.0–100.0)
MONOS PCT: 5 % (ref 3–12)
Monocytes Absolute: 0.4 10*3/uL (ref 0.1–1.0)
NEUTROS PCT: 82 % — AB (ref 43–77)
Neutro Abs: 6.1 10*3/uL (ref 1.7–7.7)
PLATELETS: 168 10*3/uL (ref 150–400)
RBC: 5.05 MIL/uL (ref 4.22–5.81)
RDW: 13.4 % (ref 11.5–15.5)
WBC: 7.4 10*3/uL (ref 4.0–10.5)

## 2014-02-16 LAB — URINALYSIS, ROUTINE W REFLEX MICROSCOPIC
BILIRUBIN URINE: NEGATIVE
Glucose, UA: NEGATIVE mg/dL
KETONES UR: NEGATIVE mg/dL
Leukocytes, UA: NEGATIVE
NITRITE: NEGATIVE
PROTEIN: NEGATIVE mg/dL
Specific Gravity, Urine: 1.01 (ref 1.005–1.030)
UROBILINOGEN UA: 0.2 mg/dL (ref 0.0–1.0)
pH: 7 (ref 5.0–8.0)

## 2014-02-16 LAB — URINE MICROSCOPIC-ADD ON

## 2014-02-16 MED ORDER — TAMSULOSIN HCL 0.4 MG PO CAPS: 0.4000 mg | ORAL_CAPSULE | Freq: Every day | ORAL | Status: DC

## 2014-02-16 MED ORDER — KETOROLAC TROMETHAMINE 30 MG/ML IJ SOLN
30.0000 mg | Freq: Once | INTRAMUSCULAR | Status: AC
  Administered 2014-02-16: 30 mg via INTRAVENOUS
  Filled 2014-02-16: qty 1

## 2014-02-16 MED ORDER — ONDANSETRON HCL 4 MG/2ML IJ SOLN
4.0000 mg | Freq: Once | INTRAMUSCULAR | Status: AC
  Administered 2014-02-16: 4 mg via INTRAVENOUS
  Filled 2014-02-16: qty 2

## 2014-02-16 MED ORDER — OXYCODONE-ACETAMINOPHEN 5-325 MG PO TABS: 1.0000 | ORAL_TABLET | Freq: Four times a day (QID) | ORAL | Status: DC | PRN

## 2014-02-16 MED ORDER — SODIUM CHLORIDE 0.9 % IV SOLN
Freq: Once | INTRAVENOUS | Status: AC
  Administered 2014-02-16: 17:00:00 via INTRAVENOUS

## 2014-02-16 MED ORDER — ONDANSETRON 4 MG PO TBDP: ORAL_TABLET | ORAL | Status: DC

## 2014-02-16 MED ORDER — HYDROMORPHONE HCL PF 1 MG/ML IJ SOLN
1.0000 mg | Freq: Once | INTRAMUSCULAR | Status: AC
  Administered 2014-02-16: 1 mg via INTRAVENOUS
  Filled 2014-02-16: qty 1

## 2014-02-16 NOTE — Discharge Instructions (Signed)
Drink plenty of fluids.  Follow up with dr. Michela Pitcher next week

## 2014-02-16 NOTE — ED Notes (Signed)
Pt c/o right side flank pain that radiates to RLQ. Pain began 1-2 hours ago. Pt also reports two episodes of vomiting. Pt denies dysuria and hematuria. Denies hx of kidney stones.

## 2014-02-16 NOTE — ED Notes (Signed)
Pt alert & oriented x4, stable gait. Patient given discharge instructions, paperwork & prescription(s). Patient  instructed to stop at the registration desk to finish any additional paperwork. Patient verbalized understanding. Pt left department w/ no further questions. 

## 2014-02-16 NOTE — ED Notes (Addendum)
Per EMS, pt reports right flank pain radiating to right side of abdomen. Pt reports pain onset x1 hour ago. Pt denies any hematuria. Pt vomited on scene per EMS, EMS admin 4 of zofran en route. Pt alert and oriented. Pt denies any dysuria,hematuria, n/d. LBM last night.

## 2014-02-16 NOTE — ED Provider Notes (Signed)
CSN: [id number]     Arrival date & time 02/16/14  1251 History   First MD Initiated Contact with Patient 02/16/14 1340    This chart was scribed for John Diego, MD by Terressa Koyanagi, ED Scribe. This patient was seen in room APA07/APA07 and the patient's care was started at 1:51 PM.  PCP: Robert Bellow, MD  Chief Complaint  Patient presents with  . Flank Pain   Patient is a 48 y.o. male presenting with flank pain. The history is provided by the patient. No language interpreter was used.  Flank Pain This is a new problem. The current episode started 1 to 2 hours ago. The problem occurs constantly. The problem has not changed since onset.Associated symptoms include abdominal pain (right sided flank pain that radiates to the RLQ). Pertinent negatives include no chest pain and no headaches. Nothing relieves the symptoms.   HPI Comments: John [name] is a 48 y.o. male who presents to the Emergency Department complaining of acute, constant, severe right sided flank pain that radiates to the RLQ, onset 2.5 hours ago. Pt also complains of associated vomiting (pt reports he has vomited 2x in the past 2.5 hours). Pt denies movement or twisting increases the pain.     Past Medical History  Diagnosis Date  . Cellulitis   . Hypertension   . Arthritis   . MRSA (methicillin resistant Staphylococcus aureus)    Past Surgical History  Procedure Laterality Date  . Mouth surgery    . Knee arthroscopy    . Shoulder surgery    . Appendectomy     History reviewed. No pertinent family history. History  Substance Use Topics  . Smoking status: Never Smoker   . Smokeless tobacco: Never Used  . Alcohol Use: Yes     Comment: occasionally    Review of Systems  Constitutional: Negative for appetite change and fatigue.  HENT: Negative for congestion, ear discharge and sinus pressure.   Eyes: Negative for discharge.  Respiratory: Negative for cough.   Cardiovascular: Negative for chest  pain.  Gastrointestinal: Positive for vomiting and abdominal pain (right sided flank pain that radiates to the RLQ). Negative for diarrhea.  Genitourinary: Positive for flank pain. Negative for frequency and hematuria.  Skin: Negative for rash.  Neurological: Negative for seizures and headaches.  Psychiatric/Behavioral: Negative for hallucinations.      Allergies  Penicillins  Home Medications   Current Outpatient Rx  Name  Route  Sig  Dispense  Refill  . aspirin 325 MG tablet   Oral   Take 325 mg by mouth daily.           . cetirizine (ZYRTEC) 10 MG tablet   Oral   Take 10 mg by mouth daily as needed. allergies          . clobetasol (TEMOVATE) 0.05 % cream   Topical   Apply 1 application topically 2 (two) times daily. cellulitis          . Cyanocobalamin (VITAMIN B 12 PO)   Oral   Take 1 tablet by mouth daily.           . cyclobenzaprine (FLEXERIL) 10 MG tablet   Oral   Take 10 mg by mouth 3 (three) times daily as needed. Muscle spasms          . diltiazem (CARDIZEM CD) 360 MG 24 hr capsule   Oral   Take 360 mg by mouth daily.           Marland Kitchen  diltiazem (DILACOR XR) 240 MG 24 hr capsule   Oral   Take 240 mg by mouth at bedtime.           . furosemide (LASIX) 40 MG tablet   Oral   Take 40 mg by mouth 2 (two) times daily as needed. Swelling, takes along with potassium          . HYDROcodone-acetaminophen (NORCO) 5-325 MG per tablet   Oral   Take 1 tablet by mouth every 6 (six) hours as needed. For pain          . omeprazole (PRILOSEC) 40 MG capsule   Oral   Take 40 mg by mouth daily.         . potassium chloride SA (K-DUR,KLOR-CON) 20 MEQ tablet   Oral   Take 20 mEq by mouth 2 (two) times daily. Along with fluid          Triage Vitals: BP 142/80  Pulse 60  Temp(Src) 97.4 F (36.3 C) (Oral)  Resp 18  Ht 6' (1.829 m)  Wt 365 lb (165.563 kg)  BMI 49.49 kg/m2  SpO2 99% Physical Exam  Constitutional: He is oriented to person, place,  and time. He appears well-developed.  HENT:  Head: Normocephalic.  Eyes: Conjunctivae and EOM are normal. No scleral icterus.  Neck: Neck supple. No thyromegaly present.  Cardiovascular: Normal rate and regular rhythm.  Exam reveals no gallop and no friction rub.   No murmur heard. Pulmonary/Chest: No stridor. He has no wheezes. He has no rales. He exhibits no tenderness.  Abdominal: He exhibits no distension. There is tenderness (Mild right lower quadrant tenderness). There is no rebound.  Musculoskeletal: Normal range of motion. He exhibits no edema.  Lymphadenopathy:    He has no cervical adenopathy.  Neurological: He is oriented to person, place, and time. He exhibits normal muscle tone. Coordination normal.  Skin: No rash noted. No erythema.  Psychiatric: He has a normal mood and affect. His behavior is normal.    ED Course  Procedures (including critical care time) DIAGNOSTIC STUDIES: Oxygen Saturation is 99% on RA, normal by my interpretation.    COORDINATION OF CARE 1:55 PM-Discussed treatment plan which includes meds,  with pt at bedside and pt agreed to plan.   Labs Review Labs Reviewed - No data to display Imaging Review No results found.   EKG Interpretation None      MDM   Final diagnoses:  None   The chart was scribed for me under my direct supervision.  I personally performed the history, physical, and medical decision making and all procedures in the evaluation of this patient.John Diego, MD 02/16/14 [phone]

## 2014-02-28 ENCOUNTER — Ambulatory Visit (HOSPITAL_COMMUNITY)
Admission: RE | Admit: 2014-02-28 | Discharge: 2014-02-28 | Disposition: A | Discharge status: Home / Self Care | Payer: Medicare Other | Source: Ambulatory Visit | Attending: Urology | Admitting: Urology

## 2014-02-28 ENCOUNTER — Other Ambulatory Visit (HOSPITAL_COMMUNITY): Payer: Self-pay | Admitting: Urology

## 2014-02-28 DIAGNOSIS — N200 Calculus of kidney: Secondary | ICD-10-CM

## 2014-02-28 DIAGNOSIS — R109 Unspecified abdominal pain: Secondary | ICD-10-CM | POA: Insufficient documentation

## 2014-02-28 DIAGNOSIS — N201 Calculus of ureter: Secondary | ICD-10-CM | POA: Insufficient documentation

## 2016-07-11 ENCOUNTER — Emergency Department (HOSPITAL_COMMUNITY)
Admission: EM | Admit: 2016-07-11 | Discharge: 2016-07-11 | Disposition: A | Discharge status: Home / Self Care | Payer: Medicare Other | Attending: Emergency Medicine | Admitting: Emergency Medicine

## 2016-07-11 ENCOUNTER — Encounter (HOSPITAL_COMMUNITY): Payer: Self-pay | Admitting: *Deleted

## 2016-07-11 ENCOUNTER — Emergency Department (HOSPITAL_COMMUNITY): Payer: Medicare Other

## 2016-07-11 DIAGNOSIS — I10 Essential (primary) hypertension: Secondary | ICD-10-CM | POA: Insufficient documentation

## 2016-07-11 DIAGNOSIS — R1032 Left lower quadrant pain: Secondary | ICD-10-CM | POA: Diagnosis not present

## 2016-07-11 DIAGNOSIS — M545 Low back pain: Secondary | ICD-10-CM | POA: Diagnosis not present

## 2016-07-11 DIAGNOSIS — Z79899 Other long term (current) drug therapy: Secondary | ICD-10-CM | POA: Diagnosis not present

## 2016-07-11 DIAGNOSIS — Z7982 Long term (current) use of aspirin: Secondary | ICD-10-CM | POA: Diagnosis not present

## 2016-07-11 LAB — COMPREHENSIVE METABOLIC PANEL
ALK PHOS: 97 U/L (ref 38–126)
ALT: 18 U/L (ref 17–63)
AST: 20 U/L (ref 15–41)
Albumin: 3.2 g/dL — ABNORMAL LOW (ref 3.5–5.0)
BILIRUBIN TOTAL: 0.7 mg/dL (ref 0.3–1.2)
BUN: 20 mg/dL (ref 6–20)
CALCIUM: 7.9 mg/dL — AB (ref 8.9–10.3)
CO2: 28 mmol/L (ref 22–32)
Chloride: 103 mmol/L (ref 101–111)
Creatinine, Ser: 1.6 mg/dL — ABNORMAL HIGH (ref 0.61–1.24)
GFR calc non Af Amer: 48 mL/min — ABNORMAL LOW (ref >60)
GFR, EST AFRICAN AMERICAN: 56 mL/min — AB (ref >60)
GLUCOSE: 97 mg/dL (ref 65–99)
Potassium: 4.4 mmol/L (ref 3.5–5.1)
SODIUM: 133 mmol/L — AB (ref 135–145)
TOTAL PROTEIN: 6.5 g/dL (ref 6.5–8.1)

## 2016-07-11 LAB — URINALYSIS, ROUTINE W REFLEX MICROSCOPIC
Bilirubin Urine: NEGATIVE
GLUCOSE, UA: NEGATIVE mg/dL
Ketones, ur: NEGATIVE mg/dL
Leukocytes, UA: NEGATIVE
Nitrite: NEGATIVE
PH: 6 (ref 5.0–8.0)
Specific Gravity, Urine: 1.025 (ref 1.005–1.030)

## 2016-07-11 LAB — CBC WITH DIFFERENTIAL/PLATELET
BASOS ABS: 0 10*3/uL (ref 0.0–0.1)
BASOS PCT: 0 %
EOS PCT: 2 %
Eosinophils Absolute: 0.1 10*3/uL (ref 0.0–0.7)
HEMATOCRIT: 41 % (ref 39.0–52.0)
HEMOGLOBIN: 13.6 g/dL (ref 13.0–17.0)
LYMPHS PCT: 19 %
Lymphs Abs: 1.3 10*3/uL (ref 0.7–4.0)
MCH: 29.1 pg (ref 26.0–34.0)
MCHC: 33.2 g/dL (ref 30.0–36.0)
MCV: 87.6 fL (ref 78.0–100.0)
MONOS PCT: 11 %
Monocytes Absolute: 0.8 10*3/uL (ref 0.1–1.0)
NEUTROS ABS: 4.7 10*3/uL (ref 1.7–7.7)
Neutrophils Relative %: 68 %
Platelets: 182 10*3/uL (ref 150–400)
RBC: 4.68 MIL/uL (ref 4.22–5.81)
RDW: 13.4 % (ref 11.5–15.5)
WBC: 6.9 10*3/uL (ref 4.0–10.5)

## 2016-07-11 LAB — URINE MICROSCOPIC-ADD ON
BACTERIA UA: NONE SEEN
Squamous Epithelial / LPF: NONE SEEN
WBC UA: NONE SEEN WBC/hpf (ref 0–5)

## 2016-07-11 MED ORDER — SODIUM CHLORIDE 0.9 % IV BOLUS (SEPSIS)
1000.0000 mL | Freq: Once | INTRAVENOUS | Status: AC
Start: 2016-07-11 — End: 2016-07-11
  Administered 2016-07-11: 1000 mL via INTRAVENOUS

## 2016-07-11 MED ORDER — KETOROLAC TROMETHAMINE 30 MG/ML IJ SOLN
30.0000 mg | Freq: Once | INTRAMUSCULAR | Status: AC
  Administered 2016-07-11: 30 mg via INTRAVENOUS
  Filled 2016-07-11: qty 1

## 2016-07-11 MED ORDER — ONDANSETRON HCL 4 MG/2ML IJ SOLN
4.0000 mg | Freq: Once | INTRAMUSCULAR | Status: AC
  Administered 2016-07-11: 4 mg via INTRAVENOUS
  Filled 2016-07-11: qty 2

## 2016-07-11 MED ORDER — CIPROFLOXACIN HCL 250 MG PO TABS
500.0000 mg | ORAL_TABLET | Freq: Once | ORAL | Status: AC
  Administered 2016-07-11: 500 mg via ORAL
  Filled 2016-07-11: qty 2

## 2016-07-11 MED ORDER — CIPROFLOXACIN HCL 500 MG PO TABS: 500.0000 mg | ORAL_TABLET | Freq: Two times a day (BID) | ORAL | 0 refills | Status: DC

## 2016-07-11 MED ORDER — ONDANSETRON 4 MG PO TBDP: 4.0000 mg | ORAL_TABLET | Freq: Three times a day (TID) | ORAL | 0 refills | Status: DC | PRN

## 2016-07-11 MED ORDER — MORPHINE SULFATE (PF) 4 MG/ML IV SOLN
4.0000 mg | INTRAVENOUS | Status: DC | PRN
  Administered 2016-07-11: 4 mg via INTRAVENOUS
  Filled 2016-07-11: qty 1

## 2016-07-11 MED ORDER — METRONIDAZOLE 500 MG PO TABS: 500.0000 mg | ORAL_TABLET | Freq: Two times a day (BID) | ORAL | 0 refills | Status: DC

## 2016-07-11 MED ORDER — HYDROCODONE-ACETAMINOPHEN 5-325 MG PO TABS: 1.0000 | ORAL_TABLET | ORAL | 0 refills | Status: DC | PRN

## 2016-07-11 MED ORDER — TAMSULOSIN HCL 0.4 MG PO CAPS
0.4000 mg | ORAL_CAPSULE | Freq: Once | ORAL | Status: AC
  Administered 2016-07-11: 0.4 mg via ORAL
  Filled 2016-07-11: qty 1

## 2016-07-11 MED ORDER — METRONIDAZOLE 500 MG PO TABS
500.0000 mg | ORAL_TABLET | Freq: Once | ORAL | Status: AC
  Administered 2016-07-11: 500 mg via ORAL
  Filled 2016-07-11: qty 1

## 2016-07-11 NOTE — ED Provider Notes (Signed)
[facility] DEPT Provider Note   CSN: [id number] Arrival date & time: 07/11/16  [id number]  By signing my name below, I, Rayna Sexton, attest that this documentation has been prepared under the direction and in the presence of Tanna Furry, MD. Electronically Signed: Rayna Sexton, ED Scribe. 07/11/16. 9:53 AM.   History   Chief Complaint Chief Complaint  Patient presents with  . Abdominal Pain    HPI HPI Comments: DAKODA GABOR is a 50 y.o. male who is morbidly obese presents to the Emergency Department complaining of constant, mild, left lower abd pain x 2 weeks. Pt states he was evaluated at [facility] on 07/05/2016 for the same symptoms and was told he had blood in his urine but denies he had imaging performed due to his body habitus. He was d/c with phenergan, hydrocodone and tramadol and states that the tramadol and hydrocodone provided mild short term pain relief. Pt reports associated lower back pain. His pain worsens with movement and palpation. He reports a history of kidney stones and confirms his current pain is similar to prior kidney stones. He was told he needed to have a lithotripsy performed last year which he states was never completed. Pt denies n/v/d or any urinary symptoms.   The history is provided by the patient. No language interpreter was used.    Past Medical History:  Diagnosis Date  . Arthritis   . Cellulitis   . Hypertension   . MRSA (methicillin resistant Staphylococcus aureus)     Patient Active Problem List   Diagnosis Date Noted  . OBESITY 03/29/2010  . CHEST PAIN 03/29/2010  . SLEEP APNEA 05/15/2009    Past Surgical History:  Procedure Laterality Date  . APPENDECTOMY    . KNEE ARTHROSCOPY    . MOUTH SURGERY    . SHOULDER SURGERY         Home Medications    Prior to Admission medications   Medication Sig Start Date End Date Taking? Authorizing Provider  aspirin 325 MG tablet Take 325 mg by mouth daily.     Yes Historical  Provider, MD  cetirizine (ZYRTEC) 10 MG tablet Take 10 mg by mouth daily as needed. allergies    Yes Historical Provider, MD  cyclobenzaprine (FLEXERIL) 10 MG tablet Take 10 mg by mouth 3 (three) times daily as needed. Muscle spasms    Yes Historical Provider, MD  diltiazem (CARDIZEM LA) 240 MG 24 hr tablet Take 240 mg by mouth daily.   Yes Historical Provider, MD  omeprazole (PRILOSEC) 40 MG capsule Take 40 mg by mouth daily. 12/20/13  Yes Historical Provider, MD  ciprofloxacin (CIPRO) 500 MG tablet Take 1 tablet (500 mg total) by mouth every 12 (twelve) hours. 07/11/16   Tanna Furry, MD  clobetasol (TEMOVATE) 0.05 % cream Apply 1 application topically 2 (two) times daily. cellulitis     Historical Provider, MD  Cyanocobalamin (VITAMIN B 12 PO) Take 1 tablet by mouth daily.      Historical Provider, MD  HYDROcodone-acetaminophen (NORCO/VICODIN) 5-325 MG tablet Take 1 tablet by mouth every 4 (four) hours as needed. 07/11/16   Tanna Furry, MD  metroNIDAZOLE (FLAGYL) 500 MG tablet Take 1 tablet (500 mg total) by mouth 2 (two) times daily. 07/11/16   Tanna Furry, MD  ondansetron (ZOFRAN ODT) 4 MG disintegrating tablet Take 1 tablet (4 mg total) by mouth every 8 (eight) hours as needed for nausea. 07/11/16   Tanna Furry, MD    Family History No family history on  file.  Social History Social History  Substance Use Topics  . Smoking status: Never Smoker  . Smokeless tobacco: Never Used  . Alcohol use Yes     Comment: occasionally     Allergies   Penicillins   Review of Systems Review of Systems  Constitutional: Negative for appetite change, chills, diaphoresis, fatigue and fever.  HENT: Negative for mouth sores, sore throat and trouble swallowing.   Eyes: Negative for visual disturbance.  Respiratory: Negative for cough, chest tightness, shortness of breath and wheezing.   Cardiovascular: Negative for chest pain.  Gastrointestinal: Positive for abdominal pain. Negative for abdominal distention,  diarrhea, nausea and vomiting.  Endocrine: Negative for polydipsia, polyphagia and polyuria.  Genitourinary: Negative for dysuria, frequency and hematuria.  Musculoskeletal: Positive for back pain. Negative for gait problem.  Skin: Negative for color change, pallor and rash.  Neurological: Negative for dizziness, syncope, light-headedness and headaches.  Hematological: Does not bruise/bleed easily.  Psychiatric/Behavioral: Negative for behavioral problems and confusion.  All other systems reviewed and are negative.  Physical Exam Updated Vital Signs BP 122/77 (BP Location: Left Arm)   Pulse (!) 57   Temp 98.2 F (36.8 C) (Oral)   Resp 18   Ht 6' (1.829 m)   Wt (!) 415 lb (188.2 kg)   SpO2 98%   BMI 56.28 kg/m   Physical Exam  Constitutional: He is oriented to person, place, and time. He appears well-developed and well-nourished. No distress.  HENT:  Head: Normocephalic.  Eyes: Conjunctivae are normal. Pupils are equal, round, and reactive to light. No scleral icterus.  Neck: Normal range of motion. Neck supple. No thyromegaly present.  Cardiovascular: Normal rate and regular rhythm.  Exam reveals no gallop and no friction rub.   No murmur heard. Pulmonary/Chest: Effort normal and breath sounds normal. No respiratory distress. He has no wheezes. He has no rales.  Abdominal: Soft. Bowel sounds are normal. He exhibits no distension. There is tenderness in the left lower quadrant. There is no rebound.  Musculoskeletal: Normal range of motion.  Neurological: He is alert and oriented to person, place, and time.  Skin: Skin is warm and dry. No rash noted.  Psychiatric: He has a normal mood and affect. His behavior is normal.   ED Treatments / Results  Labs (all labs ordered are listed, but only abnormal results are displayed) Labs Reviewed  URINALYSIS, ROUTINE W REFLEX MICROSCOPIC (NOT AT [facility]) - Abnormal; Notable for the following:       Result Value   Hgb urine dipstick SMALL  (*)    Protein, ur TRACE (*)    All other components within normal limits  COMPREHENSIVE METABOLIC PANEL - Abnormal; Notable for the following:    Sodium 133 (*)    Creatinine, Ser 1.60 (*)    Calcium 7.9 (*)    Albumin 3.2 (*)    GFR calc non Af Amer 48 (*)    GFR calc Af Amer 56 (*)    All other components within normal limits  URINE MICROSCOPIC-ADD ON  CBC WITH DIFFERENTIAL/PLATELET    EKG  EKG Interpretation None       Radiology US Renal  Result Date: 07/11/2016 CLINICAL DATA:  Left flank pain. EXAM: RENAL / URINARY TRACT ULTRASOUND COMPLETE COMPARISON:  CT scan of February 16, 2014. FINDINGS: Right Kidney: Length: 13 cm. 1.5 cm simple cyst is seen in lower pole. Echogenicity within normal limits. No mass or hydronephrosis visualized. Left Kidney: Length: 13.3 cm. Echogenicity within normal limits.  No mass or hydronephrosis visualized. Bladder: Appears normal for degree of bladder distention. IMPRESSION: No hydronephrosis or renal obstruction is noted. No significant abnormality seen in the kidneys. Electronically Signed   By: Marijo Conception, M.D.   On: 07/11/2016 11:42    Procedures Procedures  DIAGNOSTIC STUDIES: Oxygen Saturation is 100% on RA, normal by my interpretation.    COORDINATION OF CARE: 9:50 AM Discussed next steps with pt. Pt verbalized understanding and is agreeable with the plan.    Medications Ordered in ED Medications  morphine 4 MG/ML injection 4 mg (4 mg Intravenous Given 07/11/16 1025)  metroNIDAZOLE (FLAGYL) tablet 500 mg (not administered)  ciprofloxacin (CIPRO) tablet 500 mg (not administered)  ondansetron (ZOFRAN) injection 4 mg (4 mg Intravenous Given 07/11/16 1025)  ketorolac (TORADOL) 30 MG/ML injection 30 mg (30 mg Intravenous Given 07/11/16 1025)  tamsulosin (FLOMAX) capsule 0.4 mg (0.4 mg Oral Given 07/11/16 1024)  sodium chloride 0.9 % bolus 1,000 mL (1,000 mLs Intravenous New Bag/Given 07/11/16 1025)     Initial Impression / Assessment and  Plan / ED Course  I have reviewed the triage vital signs and the nursing notes.  Pertinent labs & imaging results that were available during my care of the patient were reviewed by me and considered in my medical decision making (see chart for details).  Clinical Course   I personally performed the services described in this documentation, which was scribed in my presence. The recorded information has been reviewed and is accurate.  Final Clinical Impressions(s) / ED Diagnoses   Final diagnoses:  Left lower quadrant pain    Patient has no hematuria. He has no hydronephrosis on ultrasound. His exam is relatively benign he complains of pain but really has no appreciable tenderness. He is ambulatory without an antalgic gait. Does not have fever or leukocytosis. I discussed possibilities with him. His never had a screening colonoscopy to know if he has diverticuli. This would definitely be in the differential. We discussed transfer to [facility], versus empiric treatment here. He prefers to undergo him. Treatment with Cipro Flagyl for presumed diagnosis of rectosigmoid diverticulitis. He has any worsening including vomiting pain fevers bleeding and vascular recheck at [facility]. CT scan of there is based on size, not weight. Patient may or may not be able to undergo imaging there based on his size and girth.   New Prescriptions New Prescriptions   CIPROFLOXACIN (CIPRO) 500 MG TABLET    Take 1 tablet (500 mg total) by mouth every 12 (twelve) hours.   HYDROCODONE-ACETAMINOPHEN (NORCO/VICODIN) 5-325 MG TABLET    Take 1 tablet by mouth every 4 (four) hours as needed.   METRONIDAZOLE (FLAGYL) 500 MG TABLET    Take 1 tablet (500 mg total) by mouth 2 (two) times daily.   ONDANSETRON (ZOFRAN ODT) 4 MG DISINTEGRATING TABLET    Take 1 tablet (4 mg total) by mouth every 8 (eight) hours as needed for nausea.     Tanna Furry, MD 07/11/16 1300

## 2016-07-11 NOTE — ED Triage Notes (Signed)
Pt has been having lower back pain and lower abdominal pain since the 18th. He was seen at [facility] but no scans were done because of his weight. Pt has hx of kidney stones and states this feels the same way. Pt has n/v, denies diarrhea. Denies any urinary symptoms.

## 2016-07-14 ENCOUNTER — Encounter (HOSPITAL_COMMUNITY): Payer: Self-pay | Admitting: Emergency Medicine

## 2016-07-14 ENCOUNTER — Emergency Department (HOSPITAL_COMMUNITY)
Admission: EM | Admit: 2016-07-14 | Discharge: 2016-07-14 | Disposition: A | Discharge status: Home / Self Care | Payer: Medicare Other | Attending: Emergency Medicine | Admitting: Emergency Medicine

## 2016-07-14 ENCOUNTER — Emergency Department (HOSPITAL_COMMUNITY): Payer: Medicare Other

## 2016-07-14 DIAGNOSIS — N1330 Unspecified hydronephrosis: Secondary | ICD-10-CM | POA: Insufficient documentation

## 2016-07-14 DIAGNOSIS — Z7982 Long term (current) use of aspirin: Secondary | ICD-10-CM | POA: Insufficient documentation

## 2016-07-14 DIAGNOSIS — N200 Calculus of kidney: Secondary | ICD-10-CM | POA: Diagnosis not present

## 2016-07-14 DIAGNOSIS — R1032 Left lower quadrant pain: Secondary | ICD-10-CM | POA: Diagnosis present

## 2016-07-14 DIAGNOSIS — I10 Essential (primary) hypertension: Secondary | ICD-10-CM | POA: Insufficient documentation

## 2016-07-14 LAB — COMPREHENSIVE METABOLIC PANEL
ALBUMIN: 3.6 g/dL (ref 3.5–5.0)
ALK PHOS: 101 U/L (ref 38–126)
ALT: 28 U/L (ref 17–63)
AST: 37 U/L (ref 15–41)
Anion gap: 7 (ref 5–15)
BILIRUBIN TOTAL: 1.1 mg/dL (ref 0.3–1.2)
BUN: 16 mg/dL (ref 6–20)
CALCIUM: 8.8 mg/dL — AB (ref 8.9–10.3)
CO2: 27 mmol/L (ref 22–32)
Chloride: 100 mmol/L — ABNORMAL LOW (ref 101–111)
Creatinine, Ser: 2.07 mg/dL — ABNORMAL HIGH (ref 0.61–1.24)
GFR calc Af Amer: 41 mL/min — ABNORMAL LOW (ref >60)
GFR, EST NON AFRICAN AMERICAN: 35 mL/min — AB (ref >60)
GLUCOSE: 100 mg/dL — AB (ref 65–99)
POTASSIUM: 4.6 mmol/L (ref 3.5–5.1)
Sodium: 134 mmol/L — ABNORMAL LOW (ref 135–145)
TOTAL PROTEIN: 7 g/dL (ref 6.5–8.1)

## 2016-07-14 LAB — CBC
HCT: 46.3 % (ref 39.0–52.0)
Hemoglobin: 15 g/dL (ref 13.0–17.0)
MCH: 28.8 pg (ref 26.0–34.0)
MCHC: 32.4 g/dL (ref 30.0–36.0)
MCV: 89 fL (ref 78.0–100.0)
PLATELETS: 224 10*3/uL (ref 150–400)
RBC: 5.2 MIL/uL (ref 4.22–5.81)
RDW: 13.4 % (ref 11.5–15.5)
WBC: 8.4 10*3/uL (ref 4.0–10.5)

## 2016-07-14 LAB — URINALYSIS, ROUTINE W REFLEX MICROSCOPIC
BILIRUBIN URINE: NEGATIVE
Glucose, UA: NEGATIVE mg/dL
Hgb urine dipstick: NEGATIVE
KETONES UR: 15 mg/dL — AB
LEUKOCYTES UA: NEGATIVE
NITRITE: NEGATIVE
PROTEIN: NEGATIVE mg/dL
Specific Gravity, Urine: 1.027 (ref 1.005–1.030)
pH: 5 (ref 5.0–8.0)

## 2016-07-14 LAB — LIPASE, BLOOD: Lipase: 31 U/L (ref 11–51)

## 2016-07-14 MED ORDER — MORPHINE SULFATE (PF) 4 MG/ML IV SOLN
4.0000 mg | Freq: Once | INTRAVENOUS | Status: AC
  Administered 2016-07-14: 4 mg via INTRAVENOUS
  Filled 2016-07-14: qty 1

## 2016-07-14 MED ORDER — ONDANSETRON HCL 4 MG/2ML IJ SOLN
4.0000 mg | Freq: Once | INTRAMUSCULAR | Status: AC
  Administered 2016-07-14: 4 mg via INTRAVENOUS
  Filled 2016-07-14: qty 2

## 2016-07-14 MED ORDER — OXYCODONE-ACETAMINOPHEN 5-325 MG PO TABS: 1.0000 | ORAL_TABLET | ORAL | 0 refills | Status: DC | PRN

## 2016-07-14 MED ORDER — OXYCODONE-ACETAMINOPHEN 5-325 MG PO TABS
1.0000 | ORAL_TABLET | Freq: Once | ORAL | Status: AC
  Administered 2016-07-14: 1 via ORAL
  Filled 2016-07-14: qty 1

## 2016-07-14 MED ORDER — TAMSULOSIN HCL 0.4 MG PO CAPS: 0.4000 mg | ORAL_CAPSULE | Freq: Every day | ORAL | 0 refills | Status: DC

## 2016-07-14 MED ORDER — ONDANSETRON 4 MG PO TBDP: 4.0000 mg | ORAL_TABLET | Freq: Three times a day (TID) | ORAL | 0 refills | Status: DC | PRN

## 2016-07-14 MED ORDER — SODIUM CHLORIDE 0.9 % IV BOLUS (SEPSIS)
1000.0000 mL | Freq: Once | INTRAVENOUS | Status: AC
  Administered 2016-07-14: 1000 mL via INTRAVENOUS

## 2016-07-14 NOTE — ED Triage Notes (Signed)
Pt. Stated, This all started with abdominal pain and back pain that is throbbing.  I've also had some nausea.  I went to [facility] it as a kidney stone, then I went to [facility] 5-6 days later and might have diverticulitis.  But nothing is working.

## 2016-07-14 NOTE — ED Notes (Signed)
Pt went to [facility] on 8/18-- urine was positive for blood, was given toradol and phenergan. Was seen at [facility] 3 days ago. R/o kidney stone--- urine "was clear" -- treated for diverticulitis-- was given cipro/flagyl/hydrocodone- last pain med last night.  Has pain in left flank pain, suprapubic area--pain is constant-- pt states is continually nauseated.

## 2016-07-14 NOTE — Discharge Instructions (Signed)
Call [facility] to schedule an appointment as soon as possible. Take medications as prescribed. Return to the ER for new or worsening symptoms.

## 2016-07-14 NOTE — ED Provider Notes (Signed)
[facility] DEPT Provider Note   CSN: [id number] Arrival date & time: 07/14/16  1139   History   Chief Complaint Chief Complaint  Patient presents with  . Abdominal Pain  . Flank Pain   HPI  John [name] is an 50 y.o. male with history of morbid obesity, HTN presenting to the ED for evaluation of abdominal pain. He states his pain started over two weeks ago. States initially it was in his left flank and now the pain has expanded and is now from his left flank through his suprapubic area. He was originally seen at [facility] on 8/18 and was found to have hematuria but no imaging could be performed due to body habitus. He was given phenergan and hydrocodone which have provided no relief. On 07/10/16 he was seen at [facility] for same symptoms and treated empirically for diverticulitis. However, he was told to come to [facility] if pain did not improve for possible CT scan that could accommodate his body size. He states he is here today because his pain persists. States it is severe. 7-10/10. He states he at times feels nauseated due to the pain. He states the pain feels like a kidney stone he has had in the past. He was told he needed lithotripsy but the procedure was unsuccessful. At this time he denies dysuria, urinary frequency/urgency, or gross hematuria. Denies fever or chills. Denies emesis or diarrhea.   Past Medical History:  Diagnosis Date  . Arthritis   . Cellulitis   . Hypertension   . MRSA (methicillin resistant Staphylococcus aureus)     Patient Active Problem List   Diagnosis Date Noted  . OBESITY 03/29/2010  . CHEST PAIN 03/29/2010  . SLEEP APNEA 05/15/2009    Past Surgical History:  Procedure Laterality Date  . APPENDECTOMY    . KNEE ARTHROSCOPY    . MOUTH SURGERY    . SHOULDER SURGERY       Home Medications    Prior to Admission medications   Medication Sig Start Date End Date Taking? Authorizing Provider  aspirin 325 MG tablet Take 325 mg by mouth  daily.     Yes Historical Provider, MD  ciprofloxacin (CIPRO) 500 MG tablet Take 1 tablet (500 mg total) by mouth every 12 (twelve) hours. 07/11/16  Yes Tanna Furry, MD  clobetasol (TEMOVATE) 0.05 % cream Apply 1 application topically 2 (two) times daily as needed (for cellulitis).    Yes Historical Provider, MD  Cyanocobalamin (VITAMIN B 12 PO) Take 1 tablet by mouth 3 (three) times a week.    Yes Historical Provider, MD  cyclobenzaprine (FLEXERIL) 10 MG tablet Take 10 mg by mouth 3 (three) times daily as needed for muscle spasms.    Yes Historical Provider, MD  diltiazem (CARDIZEM LA) 240 MG 24 hr tablet Take 240 mg by mouth daily.   Yes Historical Provider, MD  HYDROcodone-acetaminophen (NORCO/VICODIN) 5-325 MG tablet Take 1 tablet by mouth every 4 (four) hours as needed. Patient taking differently: Take 1 tablet by mouth every 4 (four) hours as needed for moderate pain.  07/11/16  Yes Tanna Furry, MD  metroNIDAZOLE (FLAGYL) 500 MG tablet Take 1 tablet (500 mg total) by mouth 2 (two) times daily. 07/11/16  Yes Tanna Furry, MD  omeprazole (PRILOSEC) 40 MG capsule Take 40 mg by mouth daily as needed (for acid reflux).  12/20/13  Yes Historical Provider, MD  ondansetron (ZOFRAN ODT) 4 MG disintegrating tablet Take 1 tablet (4 mg total) by mouth every  8 (eight) hours as needed for nausea. 07/11/16  Yes Tanna Furry, MD  cetirizine (ZYRTEC) 10 MG tablet Take 10 mg by mouth daily as needed for allergies.     Historical Provider, MD    Family History No family history on file.  Social History Social History  Substance Use Topics  . Smoking status: Never Smoker  . Smokeless tobacco: Never Used  . Alcohol use Yes     Comment: occasionally     Allergies   Penicillins  Review of Systems Review of Systems 10 Systems reviewed and are negative for acute change except as noted in the HPI.  Physical Exam Updated Vital Signs BP 120/87   Pulse 70   Temp 97.8 F (36.6 C) (Oral)   Resp 20   SpO2 97%    Physical Exam  Constitutional: He is oriented to person, place, and time.  HENT:  Right Ear: External ear normal.  Left Ear: External ear normal.  Nose: Nose normal.  Mouth/Throat: Oropharynx is clear and moist. No oropharyngeal exudate.  Eyes: Conjunctivae are normal.  Neck: Neck supple.  Cardiovascular: Normal rate, regular rhythm, normal heart sounds and intact distal pulses.   Pulmonary/Chest: Effort normal and breath sounds normal. No respiratory distress. He has no wheezes.  Abdominal: Soft. Bowel sounds are normal. He exhibits no distension. There is no rebound and no guarding.  Morbidly obese Difficult to assess abdominal exam. States pain is worse with certain positions. Some mild suprapubic tenderness without rebound or guarding  Musculoskeletal: He exhibits no edema.  Lymphadenopathy:    He has no cervical adenopathy.  Neurological: He is alert and oriented to person, place, and time. No cranial nerve deficit.  Skin: Skin is warm and dry.  Psychiatric: He has a normal mood and affect.  Nursing note and vitals reviewed.    ED Treatments / Results  Labs (all labs ordered are listed, but only abnormal results are displayed) Labs Reviewed  COMPREHENSIVE METABOLIC PANEL - Abnormal; Notable for the following:       Result Value   Sodium 134 (*)    Chloride 100 (*)    Glucose, Bld 100 (*)    Creatinine, Ser 2.07 (*)    Calcium 8.8 (*)    GFR calc non Af Amer 35 (*)    GFR calc Af Amer 41 (*)    All other components within normal limits  URINALYSIS, ROUTINE W REFLEX MICROSCOPIC (NOT AT [facility]) - Abnormal; Notable for the following:    Ketones, ur 15 (*)    All other components within normal limits  LIPASE, BLOOD  CBC    EKG  EKG Interpretation None       Radiology Ct Renal Stone Study  Result Date: 07/14/2016 CLINICAL DATA:  Left lower quadrant pain. EXAM: CT ABDOMEN AND PELVIS WITHOUT CONTRAST TECHNIQUE: Multidetector CT imaging of the abdomen and pelvis  was performed following the standard protocol without IV contrast. COMPARISON:  02/16/2014 FINDINGS: Lower chest: No acute findings. Stable 5 mm nodule in the left lower lobe. No pleural or pericardial effusion. Hepatobiliary: No mass visualized on this un-enhanced exam. 6 mm stone within the gallbladder is identified. No gallbladder wall thickening. No biliary dilatation noted. Pancreas: No mass or inflammatory process identified on this un-enhanced exam. Spleen: Within normal limits in size. Adrenals/Urinary Tract: Normal appearance of the adrenal glands. 3.5 cm cyst arising from inferior pole of the right kidney is incompletely characterized without IV contrast. There is left-sided hydronephrosis. A stone is at  the left UPJ which measures 6 mm. The urinary bladder appears normal. Stomach/Bowel: The stomach is normal. The small bowel loops have a normal course and caliber. No bowel obstruction. Vascular/Lymphatic: Normal appearance of the abdominal aorta. No enlarged retroperitoneal or mesenteric adenopathy. No enlarged pelvic or inguinal lymph nodes. Reproductive: No mass or other significant abnormality. Other: None. Musculoskeletal: No suspicious bone lesions identified. 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 IMPRESSION: 1. Left hydronephrosis secondary to is 6 mm UPJ calculus. 2. Gallstone. Electronically Signed   By: Kerby Moors M.D.   On: 07/14/2016 18:29    Procedures Procedures (including critical care time)  Medications Ordered in ED Medications - No data to display   Initial Impression / Assessment and Plan / ED Course  I have reviewed the triage vital signs and the nursing notes.  Pertinent labs & imaging results that were available during my care of the patient were reviewed by me and considered in my medical decision making (see chart for details).  Clinical Course   Initial labs reveal urine with 15 ketones, AKI with a creatinine today of  2.07, CBC and lipase unremarkable. I spoke with CT and confirmed pt will fit for scan today. Due to AKI will obtain CT abd/pelvis non con. Fluids, pain meds, and anti-emetics ordered.  CT reveals 58mm left UPJ calculus with some hydronephrosis. Pt nontoxic appearing, no conomittant infection, hemodynamically stable. Rx given for pain meds and flomax. Encouraged close f/u with urology. ER return precautions given.  Final Clinical Impressions(s) / ED Diagnoses   Final diagnoses:  Kidney stone on left side    New Prescriptions Discharge Medication List as of 07/14/2016  6:58 PM    START taking these medications   Details  !! ondansetron (ZOFRAN ODT) 4 MG disintegrating tablet Take 1 tablet (4 mg total) by mouth every 8 (eight) hours as needed for nausea or vomiting., Starting Sun 07/14/2016, Print    oxyCODONE-acetaminophen (PERCOCET/ROXICET) 5-325 MG tablet Take 1-2 tablets by mouth every 4 (four) hours as needed for severe pain., Starting Sun 07/14/2016, Print    tamsulosin (FLOMAX) 0.4 MG CAPS capsule Take 1 capsule (0.4 mg total) by mouth daily., Starting Sun 07/14/2016, Print     !! - Potential duplicate medications found. Please discuss with provider.       Anne Ng, PA-C 07/15/16 1531    Dorie Rank, MD 07/16/16 [phone]

## 2016-07-24 ENCOUNTER — Other Ambulatory Visit: Payer: Self-pay | Admitting: Urology

## 2016-07-24 ENCOUNTER — Encounter (HOSPITAL_COMMUNITY): Payer: Self-pay | Admitting: *Deleted

## 2016-07-24 NOTE — Progress Notes (Signed)
Spoke to patient via phone,history obtained,updated.  Bring blue folder,insurance cards,picture ID,designated driver Reinforced no aspirin(instructions to hold aspirin per your doctor), ibuprofen products 72 hours prior to procedure.    Follow laxative instructions provided by urologist (office) and in blue folder. Wear easy on/off clothing and no jewelry except wedding rings and ear rings. Leave all other valuables at home. understanding of Light breakfast and to be NPO after 0700  tPlease bring your C_PAP/Breathing machine if you use one pt verbalized understanding Pt states he is going to [facility] tomorrow to get his blue folder and was given instructions about going to [facility] entrance of [facility] so he could valet parking and go to admitting. He was instructed to be in admitting before 1430 so he could be in short stay by 1400. Pt verbalized instructions.

## 2016-07-25 ENCOUNTER — Ambulatory Visit (HOSPITAL_COMMUNITY): Payer: Medicare Other

## 2016-07-25 ENCOUNTER — Encounter (HOSPITAL_COMMUNITY): Payer: Self-pay | Admitting: General Practice

## 2016-07-25 ENCOUNTER — Encounter (HOSPITAL_COMMUNITY)
Admission: RE | Disposition: A | Discharge status: Home / Self Care | Payer: Self-pay | Source: Ambulatory Visit | Attending: Urology

## 2016-07-25 ENCOUNTER — Ambulatory Visit (HOSPITAL_COMMUNITY)
Admission: RE | Admit: 2016-07-25 | Discharge: 2016-07-25 | Disposition: A | Discharge status: Home / Self Care | Payer: Medicare Other | Source: Ambulatory Visit | Attending: Urology | Admitting: Urology

## 2016-07-25 DIAGNOSIS — Z87442 Personal history of urinary calculi: Secondary | ICD-10-CM | POA: Diagnosis not present

## 2016-07-25 DIAGNOSIS — Z7982 Long term (current) use of aspirin: Secondary | ICD-10-CM | POA: Insufficient documentation

## 2016-07-25 DIAGNOSIS — I10 Essential (primary) hypertension: Secondary | ICD-10-CM | POA: Insufficient documentation

## 2016-07-25 DIAGNOSIS — N201 Calculus of ureter: Secondary | ICD-10-CM | POA: Insufficient documentation

## 2016-07-25 DIAGNOSIS — G4730 Sleep apnea, unspecified: Secondary | ICD-10-CM | POA: Diagnosis not present

## 2016-07-25 HISTORY — DX: Sleep apnea, unspecified: G47.30

## 2016-07-25 HISTORY — DX: Personal history of urinary calculi: Z87.442

## 2016-07-25 SURGERY — LITHOTRIPSY, ESWL
Anesthesia: LOCAL | Laterality: Left

## 2016-07-25 MED ORDER — DIAZEPAM 5 MG PO TABS
10.0000 mg | ORAL_TABLET | ORAL | Status: AC
  Administered 2016-07-25: 10 mg via ORAL
  Filled 2016-07-25: qty 2

## 2016-07-25 MED ORDER — SODIUM CHLORIDE 0.9 % IV SOLN
INTRAVENOUS | Status: DC
  Administered 2016-07-25: 15:00:00 via INTRAVENOUS

## 2016-07-25 MED ORDER — DIPHENHYDRAMINE HCL 25 MG PO CAPS
25.0000 mg | ORAL_CAPSULE | ORAL | Status: AC
  Administered 2016-07-25: 25 mg via ORAL
  Filled 2016-07-25: qty 1

## 2016-07-25 MED ORDER — CIPROFLOXACIN HCL 500 MG PO TABS
500.0000 mg | ORAL_TABLET | ORAL | Status: AC
  Administered 2016-07-25: 500 mg via ORAL
  Filled 2016-07-25: qty 1

## 2016-07-25 NOTE — Discharge Instructions (Signed)
Moderate Conscious Sedation, Adult, Care After °Refer to this sheet in the next few weeks. These instructions provide you with information on caring for yourself after your procedure. Your health care provider may also give you more specific instructions. Your treatment has been planned according to current medical practices, but problems sometimes occur. Call your health care provider if you have any problems or questions after your procedure. °WHAT TO EXPECT AFTER THE PROCEDURE  °After your procedure: °· You may feel sleepy, clumsy, and have poor balance for several hours. °· Vomiting may occur if you eat too soon after the procedure. °HOME CARE INSTRUCTIONS °· Do not participate in any activities where you could become injured for at least 24 hours. Do not: °¨ Drive. °¨ Swim. °¨ Ride a bicycle. °¨ Operate heavy machinery. °¨ Cook. °¨ Use power tools. °¨ Climb ladders. °¨ Work from a high place. °· Do not make important decisions or sign legal documents until you are improved. °· If you vomit, drink water, juice, or soup when you can drink without vomiting. Make sure you have little or no nausea before eating solid foods. °· Only take over-the-counter or prescription medicines for pain, discomfort, or fever as directed by your health care provider. °· Make sure you and your family fully understand everything about the medicines given to you, including what side effects may occur. °· You should not drink alcohol, take sleeping pills, or take medicines that cause drowsiness for at least 24 hours. °· If you smoke, do not smoke without supervision. °· If you are feeling better, you may resume normal activities 24 hours after you were sedated. °· Keep all appointments with your health care provider. °SEEK MEDICAL CARE IF: °· Your skin is pale or bluish in color. °· You continue to feel nauseous or vomit. °· Your pain is getting worse and is not helped by medicine. °· You have bleeding or swelling. °· You are still  sleepy or feeling clumsy after 24 hours. °SEEK IMMEDIATE MEDICAL CARE IF: °· You develop a rash. °· You have difficulty breathing. °· You develop any type of allergic problem. °· You have a fever. °MAKE SURE YOU: °· Understand these instructions. °· Will watch your condition. °· Will get help right away if you are not doing well or get worse. °  °This information is not intended to replace advice given to you by your health care provider. Make sure you discuss any questions you have with your health care provider. °  °Document Released: 08/25/2013 Document Revised: 11/25/2014 Document Reviewed: 08/25/2013 °Elsevier Interactive Patient Education ©2016 [employer] ° ° °

## 2016-07-25 NOTE — Progress Notes (Signed)
Procedure cancelled per Dr. Alyson Ingles,  Unable to localize stone, pt. To be scheduled for surgery thru office. Pt. And pt's family instructed that office will schedule and call pt. At home, family verbalized understanding.

## 2016-08-14 ENCOUNTER — Ambulatory Visit: Payer: Medicare Other | Admitting: Urology

## 2016-08-22 ENCOUNTER — Other Ambulatory Visit: Payer: Self-pay

## 2016-08-22 DIAGNOSIS — N201 Calculus of ureter: Secondary | ICD-10-CM

## 2016-08-22 NOTE — Patient Instructions (Signed)
John [name]  08/22/2016     @PREFPERIOPPHARMACY @   Your procedure is scheduled on  08/28/2016  Report to [facility] at  40  A.M.  Call this number if you have problems the morning of surgery:  [phone]   Remember:  Do not eat food or drink liquids after midnight.  Take these medicines the morning of surgery with A SIP OF WATER  Zyrtec, flexaril, cardiazem, hydrocodone or oxycodone,prilosec, zofran, flomax.   Do not wear jewelry, make-up or nail polish.  Do not wear lotions, powders, or perfumes, or deoderant.  Do not shave 48 hours prior to surgery.  Men may shave face and neck.  Do not bring valuables to the hospital.  [facility] is not responsible for any belongings or valuables.  Contacts, dentures or bridgework may not be worn into surgery.  Leave your suitcase in the car.  After surgery it may be brought to your room.  For patients admitted to the hospital, discharge time will be determined by your treatment team.  Patients discharged the day of surgery will not be allowed to drive home.   Name and phone number of your driver:   family Special instructions:  none  Please read over the following fact sheets that you were given. Surgical Site Infection Prevention and Anesthesia Post-op Instructions       Ureteral Stent Implantation Ureteral stent implantation is the implantation of a soft plastic tube with multiple holes into the tube that drains urine from your kidney to your bladder (ureter). The stent helps drain your kidney when there is a blockage of the flow of urine in your ureter. The stent has a coil on each end to keep it from falling out. One end stays in the kidney. The other end stays in the bladder. It is most often taken out after any blockage has been removed or your ureter has healed. Short-term stents have a string attached to make removal quite easy. Removal of a short-term stent can be done in your health care provider's  office or by you at home. Long-term stents need to be changed every few months. LET [facility] CARE PROVIDER KNOW ABOUT:  Any allergies you have.  All medicines you are taking, including vitamins, herbs, eye drops, creams, and over-the-counter medicines.  Previous problems you or members of your family have had with the use of anesthetics.  Any blood disorders you have.  Previous surgeries you have had.  Medical conditions you have. RISKS AND COMPLICATIONS Generally, ureteral stent implantation is a safe procedure. However, as with any procedure, complications can occur. Possible complications include:  Movement of the stent away from where it was originally placed (migration). This may affect the ability of the stent to properly drain your kidney. If migration of the stent occurs, the stent may need to be replaced or repositioned.  Perforation of the ureter.  Infection. BEFORE THE PROCEDURE  You may be asked to wash your genital area with sterile soap the morning of your procedure.  You may be given an oral antibiotic which you should take with a sip of water as prescribed by your health care provider.  You may be asked to not eat or drink for 8 hours before the surgery. PROCEDURE  First you will be given an anesthetic so you do not feel pain during the procedure.  Your health care provider will insert a special lighted instrument called a  cystoscope into your bladder. This allows your health care provider to see the opening to your ureter.  A thin wire is carefully threaded into your bladder and up the ureter. The stent is inserted over the wire and the wire is then removed.  Your bladder will be emptied of urine. AFTER THE PROCEDURE You will be taken to a recovery room until it is okay for you to go home.   This information is not intended to replace advice given to you by your health care provider. Make sure you discuss any questions you have with your health care  provider.   Document Released: 11/01/2000 Document Revised: 11/09/2013 Document Reviewed: 05/19/2015 Elsevier Interactive Patient Education 2016 [employer] Ureteral Stent Implantation, Care After Refer to this sheet in the next few weeks. These instructions provide you with information on caring for yourself after your procedure. Your health care provider may also give you more specific instructions. Your treatment has been planned according to current medical practices, but problems sometimes occur. Call your health care provider if you have any problems or questions after your procedure. WHAT TO EXPECT AFTER THE PROCEDURE You should be back to normal activity within 48 hours after the procedure. Nausea and vomiting may occur and are commonly the result of anesthesia. It is common to experience sharp pain in the back or lower abdomen and penis with voiding. This is caused by movement of the ends of the stent with the act of urinating.It usually goes away within minutes after you have stopped urinating. HOME CARE INSTRUCTIONS Make sure to drink plenty of fluids. You may have small amounts of bleeding, causing your urine to be red. This is normal. Certain movements may trigger pain or a feeling that you need to urinate. You may be given medicines to prevent infection or bladder spasms. Be sure to take all medicines as directed. Only take over-the-counter or prescription medicines for pain, discomfort, or fever as directed by your health care provider. Do not take aspirin, as this can make bleeding worse. Your stent will be left in until the blockage is resolved. This may take 2 weeks or longer, depending on the reason for stent implantation. You may have an X-ray exam to make sure your ureter is open and that the stent has not moved out of position (migrated). The stent can be removed by your health care provider in the office. Medicines may be given for comfort while the stent is being removed. Be  sure to keep all follow-up appointments so your health care provider can check that you are healing properly. SEEK MEDICAL CARE IF:  You experience increasing pain.  Your pain medicine is not working. SEEK IMMEDIATE MEDICAL CARE IF:  Your urine is dark red or has blood clots.  You are leaking urine (incontinent).  You have a fever, chills, feeling sick to your stomach (nausea), or vomiting.  Your pain is not relieved by pain medicine.  The end of the stent comes out of the urethra.  You are unable to urinate.   This information is not intended to replace advice given to you by your health care provider. Make sure you discuss any questions you have with your health care provider.   Document Released: 07/07/2013 Document Revised: 11/09/2013 Document Reviewed: 05/19/2015 Elsevier Interactive Patient Education [employer].  Cystoscopy Cystoscopy is a procedure that is used to help your caregiver diagnose and sometimes treat conditions that affect your lower urinary tract. Your lower urinary tract includes your bladder  and the tube through which urine passes from your bladder out of your body (urethra). Cystoscopy is performed with a thin, tube-shaped instrument (cystoscope). The cystoscope has lenses and a light at the end so that your caregiver can see inside your bladder. The cystoscope is inserted at the entrance of your urethra. Your caregiver guides it through your urethra and into your bladder. There are two main types of cystoscopy:  Flexible cystoscopy (with a flexible cystoscope).  Rigid cystoscopy (with a rigid cystoscope). Cystoscopy may be recommended for many conditions, including:  Urinary tract infections.  Blood in your urine (hematuria).  Loss of bladder control (urinary incontinence) or overactive bladder.  Unusual cells found in a urine sample.  Urinary blockage.  Painful urination. Cystoscopy may also be done to remove a sample of your tissue to be  checked under a microscope (biopsy). It may also be done to remove or destroy bladder stones. LET YOUR CAREGIVER KNOW ABOUT:  Allergies to food or medicine.  Medicines taken, including vitamins, herbs, eyedrops, over-the-counter medicines, and creams.  Use of steroids (by mouth or creams).  Previous problems with anesthetics or numbing medicines.  History of bleeding problems or blood clots.  Previous surgery.  Other health problems, including diabetes and kidney problems.  Possibility of pregnancy, if this applies. PROCEDURE The area around the opening to your urethra will be cleaned. A medicine to numb your urethra (local anesthetic) is used. If a tissue sample or stone is removed during the procedure, you may be given a medicine to make you sleep (general anesthetic). Your caregiver will gently insert the tip of the cystoscope into your urethra. The cystoscope will be slowly glided through your urethra and into your bladder. Sterile fluid will flow through the cystoscope and into your bladder. The fluid will expand and stretch your bladder. This gives your caregiver a better view of your bladder walls. The procedure lasts about 15-20 minutes. AFTER THE PROCEDURE If a local anesthetic is used, you will be allowed to go home as soon as you are ready. If a general anesthetic is used, you will be taken to a recovery area until you are stable. You may have temporary bleeding and burning on urination.   This information is not intended to replace advice given to you by your health care provider. Make sure you discuss any questions you have with your health care provider.   Document Released: 11/01/2000 Document Revised: 11/25/2014 Document Reviewed: 04/27/2012 Elsevier Interactive Patient Education 2016 [employer]. Cystoscopy, Care After Refer to this sheet in the next few weeks. These instructions provide you with information on caring for yourself after your procedure. Your caregiver  may also give you more specific instructions. Your treatment has been planned according to current medical practices, but problems sometimes occur. Call your caregiver if you have any problems or questions after your procedure. HOME CARE INSTRUCTIONS  Things you can do to ease any discomfort after your procedure include:  Drinking enough water and fluids to keep your urine clear or pale yellow.  Taking a warm bath to relieve any burning feelings. SEEK IMMEDIATE MEDICAL CARE IF:   You have an increase in blood in your urine.  You notice blood clots in your urine.  You have difficulty passing urine.  You have the chills.  You have abdominal pain.  You have a fever or persistent symptoms for more than 2-3 days.  You have a fever and your symptoms suddenly get worse. MAKE SURE YOU:   Understand  these instructions.  Will watch your condition.  Will get help right away if you are not doing well or get worse.   This information is not intended to replace advice given to you by your health care provider. Make sure you discuss any questions you have with your health care provider.   Document Released: 05/24/2005 Document Revised: 11/25/2014 Document Reviewed: 04/27/2012 Elsevier Interactive Patient Education 2016 Elsevier Inc. PATIENT INSTRUCTIONS POST-ANESTHESIA  IMMEDIATELY FOLLOWING SURGERY:  Do not drive or operate machinery for the first twenty four hours after surgery.  Do not make any important decisions for twenty four hours after surgery or while taking narcotic pain medications or sedatives.  If you develop intractable nausea and vomiting or a severe headache please notify your doctor immediately.  FOLLOW-UP:  Please make an appointment with your surgeon as instructed. You do not need to follow up with anesthesia unless specifically instructed to do so.  WOUND CARE INSTRUCTIONS (if applicable):  Keep a dry clean dressing on the anesthesia/puncture wound site if there is  drainage.  Once the wound has quit draining you may leave it open to air.  Generally you should leave the bandage intact for twenty four hours unless there is drainage.  If the epidural site drains for more than 36-48 hours please call the anesthesia department.  QUESTIONS?:  Please feel free to call your physician or the hospital operator if you have any questions, and they will be happy to assist you.

## 2016-08-23 ENCOUNTER — Encounter (HOSPITAL_COMMUNITY)
Admission: RE | Admit: 2016-08-23 | Discharge: 2016-08-23 | Disposition: A | Discharge status: Home / Self Care | Payer: Medicare Other | Source: Ambulatory Visit | Attending: Urology | Admitting: Urology

## 2016-08-23 ENCOUNTER — Encounter (HOSPITAL_COMMUNITY): Payer: Self-pay

## 2016-08-23 DIAGNOSIS — N201 Calculus of ureter: Secondary | ICD-10-CM | POA: Insufficient documentation

## 2016-08-23 DIAGNOSIS — R109 Unspecified abdominal pain: Secondary | ICD-10-CM | POA: Diagnosis not present

## 2016-08-23 DIAGNOSIS — Z01810 Encounter for preprocedural cardiovascular examination: Secondary | ICD-10-CM | POA: Diagnosis not present

## 2016-08-23 DIAGNOSIS — I10 Essential (primary) hypertension: Secondary | ICD-10-CM | POA: Insufficient documentation

## 2016-08-23 HISTORY — DX: Unspecified nephritic syndrome with unspecified morphologic changes: N05.9

## 2016-08-23 HISTORY — DX: Unspecified convulsions: R56.9

## 2016-08-23 HISTORY — DX: Gastro-esophageal reflux disease without esophagitis: K21.9

## 2016-08-28 ENCOUNTER — Ambulatory Visit (HOSPITAL_COMMUNITY)
Admission: RE | Admit: 2016-08-28 | Discharge: 2016-08-28 | Disposition: A | Discharge status: Home / Self Care | Payer: Medicare Other | Source: Ambulatory Visit | Attending: Urology | Admitting: Urology

## 2016-08-28 ENCOUNTER — Encounter (HOSPITAL_COMMUNITY): Payer: Self-pay | Admitting: *Deleted

## 2016-08-28 ENCOUNTER — Ambulatory Visit (HOSPITAL_COMMUNITY): Payer: Medicare Other

## 2016-08-28 ENCOUNTER — Ambulatory Visit (HOSPITAL_COMMUNITY): Payer: Medicare Other | Admitting: Anesthesiology

## 2016-08-28 ENCOUNTER — Encounter (HOSPITAL_COMMUNITY)
Admission: RE | Disposition: A | Discharge status: Home / Self Care | Payer: Self-pay | Source: Ambulatory Visit | Attending: Urology

## 2016-08-28 DIAGNOSIS — G4730 Sleep apnea, unspecified: Secondary | ICD-10-CM | POA: Insufficient documentation

## 2016-08-28 DIAGNOSIS — M1990 Unspecified osteoarthritis, unspecified site: Secondary | ICD-10-CM | POA: Insufficient documentation

## 2016-08-28 DIAGNOSIS — Z880 Allergy status to penicillin: Secondary | ICD-10-CM | POA: Diagnosis not present

## 2016-08-28 DIAGNOSIS — Z7982 Long term (current) use of aspirin: Secondary | ICD-10-CM | POA: Diagnosis not present

## 2016-08-28 DIAGNOSIS — Z6841 Body Mass Index (BMI) 40.0 and over, adult: Secondary | ICD-10-CM | POA: Insufficient documentation

## 2016-08-28 DIAGNOSIS — Z87442 Personal history of urinary calculi: Secondary | ICD-10-CM | POA: Diagnosis not present

## 2016-08-28 DIAGNOSIS — N1330 Unspecified hydronephrosis: Secondary | ICD-10-CM | POA: Insufficient documentation

## 2016-08-28 DIAGNOSIS — N132 Hydronephrosis with renal and ureteral calculous obstruction: Secondary | ICD-10-CM | POA: Diagnosis not present

## 2016-08-28 DIAGNOSIS — K219 Gastro-esophageal reflux disease without esophagitis: Secondary | ICD-10-CM | POA: Insufficient documentation

## 2016-08-28 DIAGNOSIS — Z8614 Personal history of Methicillin resistant Staphylococcus aureus infection: Secondary | ICD-10-CM | POA: Insufficient documentation

## 2016-08-28 DIAGNOSIS — I10 Essential (primary) hypertension: Secondary | ICD-10-CM | POA: Insufficient documentation

## 2016-08-28 HISTORY — PX: CYSTOSCOPY WITH STENT PLACEMENT: SHX5790

## 2016-08-28 HISTORY — PX: CYSTOSCOPY/RETROGRADE/URETEROSCOPY/STONE EXTRACTION WITH BASKET: SHX5317

## 2016-08-28 SURGERY — CYSTOSCOPY, WITH CALCULUS REMOVAL USING BASKET
Anesthesia: General | Laterality: Left

## 2016-08-28 MED ORDER — DIATRIZOATE MEGLUMINE 30 % UR SOLN
URETHRAL | Status: DC | PRN
  Administered 2016-08-28: 100 mL via URETHRAL

## 2016-08-28 MED ORDER — LIDOCAINE HCL 1 % IJ SOLN
INTRAMUSCULAR | Status: DC | PRN
  Administered 2016-08-28: 40 mg via INTRADERMAL

## 2016-08-28 MED ORDER — OXYCODONE-ACETAMINOPHEN 5-325 MG PO TABS: 1.0000 | ORAL_TABLET | ORAL | 0 refills | Status: DC | PRN

## 2016-08-28 MED ORDER — MIDAZOLAM HCL 2 MG/2ML IJ SOLN
1.0000 mg | INTRAMUSCULAR | Status: DC | PRN
  Administered 2016-08-28: 2 mg via INTRAVENOUS

## 2016-08-28 MED ORDER — PROPOFOL 10 MG/ML IV BOLUS
INTRAVENOUS | Status: DC | PRN
  Administered 2016-08-28: 200 mg via INTRAVENOUS

## 2016-08-28 MED ORDER — CIPROFLOXACIN IN D5W 400 MG/200ML IV SOLN
400.0000 mg | INTRAVENOUS | Status: AC
  Administered 2016-08-28: 400 mg via INTRAVENOUS

## 2016-08-28 MED ORDER — ROCURONIUM BROMIDE 50 MG/5ML IV SOLN
INTRAVENOUS | Status: AC
  Filled 2016-08-28: qty 1

## 2016-08-28 MED ORDER — CIPROFLOXACIN IN D5W 400 MG/200ML IV SOLN
INTRAVENOUS | Status: AC
  Filled 2016-08-28: qty 200

## 2016-08-28 MED ORDER — ATROPINE SULFATE 0.4 MG/ML IJ SOLN
INTRAMUSCULAR | Status: AC
  Filled 2016-08-28: qty 1

## 2016-08-28 MED ORDER — ROCURONIUM BROMIDE 100 MG/10ML IV SOLN
INTRAVENOUS | Status: DC | PRN
  Administered 2016-08-28: 5 mg via INTRAVENOUS

## 2016-08-28 MED ORDER — LACTATED RINGERS IV SOLN
INTRAVENOUS | Status: DC
  Administered 2016-08-28: 13:00:00 via INTRAVENOUS
  Administered 2016-08-28: 1000 mL via INTRAVENOUS

## 2016-08-28 MED ORDER — LIDOCAINE HCL (PF) 1 % IJ SOLN
INTRAMUSCULAR | Status: AC
  Filled 2016-08-28: qty 5

## 2016-08-28 MED ORDER — TAMSULOSIN HCL 0.4 MG PO CAPS: 0.4000 mg | ORAL_CAPSULE | Freq: Every day | ORAL | 0 refills | Status: DC

## 2016-08-28 MED ORDER — HYDROMORPHONE HCL 1 MG/ML IJ SOLN
0.2500 mg | INTRAMUSCULAR | Status: DC | PRN
  Administered 2016-08-28: 0.25 mg via INTRAVENOUS
  Filled 2016-08-28: qty 1

## 2016-08-28 MED ORDER — DEXTROSE 5 % IV SOLN
INTRAVENOUS | Status: DC | PRN
Start: 2016-08-28 — End: 2016-08-28
  Administered 2016-08-28: 12:00:00 via INTRAVENOUS

## 2016-08-28 MED ORDER — MIDAZOLAM HCL 5 MG/5ML IJ SOLN
INTRAMUSCULAR | Status: DC | PRN
  Administered 2016-08-28 (×2): 1 mg via INTRAVENOUS

## 2016-08-28 MED ORDER — SUCCINYLCHOLINE CHLORIDE 20 MG/ML IJ SOLN
INTRAMUSCULAR | Status: DC | PRN
  Administered 2016-08-28: 200 mg via INTRAVENOUS

## 2016-08-28 MED ORDER — SODIUM CHLORIDE 0.9 % IR SOLN
Status: DC | PRN
  Administered 2016-08-28 (×2): 3000 mL

## 2016-08-28 MED ORDER — PROPOFOL 10 MG/ML IV BOLUS
INTRAVENOUS | Status: AC
  Filled 2016-08-28: qty 40

## 2016-08-28 MED ORDER — ONDANSETRON HCL 4 MG/2ML IJ SOLN
INTRAMUSCULAR | Status: AC
  Filled 2016-08-28: qty 2

## 2016-08-28 MED ORDER — FENTANYL CITRATE (PF) 100 MCG/2ML IJ SOLN
INTRAMUSCULAR | Status: AC
  Filled 2016-08-28: qty 2

## 2016-08-28 MED ORDER — MIDAZOLAM HCL 2 MG/2ML IJ SOLN
INTRAMUSCULAR | Status: AC
  Filled 2016-08-28: qty 2

## 2016-08-28 MED ORDER — CHLORHEXIDINE GLUCONATE CLOTH 2 % EX PADS: 6.0000 | MEDICATED_PAD | Freq: Once | CUTANEOUS | Status: DC

## 2016-08-28 MED ORDER — ONDANSETRON HCL 4 MG/2ML IJ SOLN
4.0000 mg | Freq: Once | INTRAMUSCULAR | Status: AC
  Administered 2016-08-28: 4 mg via INTRAVENOUS

## 2016-08-28 MED ORDER — DIATRIZOATE MEGLUMINE 30 % UR SOLN
URETHRAL | Status: AC
  Filled 2016-08-28: qty 300

## 2016-08-28 MED ORDER — FENTANYL CITRATE (PF) 100 MCG/2ML IJ SOLN
INTRAMUSCULAR | Status: DC | PRN
  Administered 2016-08-28: 25 ug via INTRAVENOUS

## 2016-08-28 SURGICAL SUPPLY — 22 items
BAG DRAIN URO TABLE W/ADPT NS (DRAPE) ×3 IMPLANT
BAG DRN 8 ADPR NS SKTRN CSTL (DRAPE) ×1
BAG HAMPER (MISCELLANEOUS) ×3 IMPLANT
CATH INTERMIT  6FR 70CM (CATHETERS) ×2 IMPLANT
CLOTH BEACON ORANGE TIMEOUT ST (SAFETY) ×3 IMPLANT
EXTRACTOR STONE NITINOL NGAGE (UROLOGICAL SUPPLIES) ×3 IMPLANT
FIBER LASER FLEXIVA 200 (UROLOGICAL SUPPLIES) ×3 IMPLANT
FIBER LASER FLEXIVA 365 (UROLOGICAL SUPPLIES) IMPLANT
GLOVE BIO SURGEON STRL SZ8 (GLOVE) ×3 IMPLANT
GOWN STRL REUS W/ TWL LRG LVL3 (GOWN DISPOSABLE) ×1 IMPLANT
GOWN STRL REUS W/ TWL XL LVL3 (GOWN DISPOSABLE) ×1 IMPLANT
GOWN STRL REUS W/TWL LRG LVL3 (GOWN DISPOSABLE) ×3
GOWN STRL REUS W/TWL XL LVL3 (GOWN DISPOSABLE) ×3
GUIDEWIRE STR DUAL SENSOR (WIRE) IMPLANT
GUIDEWIRE STR ZIPWIRE 035X150 (MISCELLANEOUS) ×3 IMPLANT
IV NS IRRIG 3000ML ARTHROMATIC (IV SOLUTION) ×6 IMPLANT
MANIFOLD NEPTUNE II (INSTRUMENTS) IMPLANT
PACK CYSTO (CUSTOM PROCEDURE TRAY) ×3 IMPLANT
PAD ARMBOARD 7.5X6 YLW CONV (MISCELLANEOUS) ×3 IMPLANT
SHEATH ACCESS URETERAL 38CM (SHEATH) ×2 IMPLANT
STENT URET 6FRX26 CONTOUR (STENTS) ×2 IMPLANT
SYRINGE 10CC LL (SYRINGE) ×3 IMPLANT

## 2016-08-28 NOTE — Anesthesia Procedure Notes (Signed)
Procedure Name: Intubation Date/Time: 08/28/2016 12:41 PM Performed by: Charmaine Downs Pre-anesthesia Checklist: Patient identified, Patient being monitored, Timeout performed, Emergency Drugs available and Suction available Patient Re-evaluated:Patient Re-evaluated prior to inductionOxygen Delivery Method: Circle System Utilized Preoxygenation: Pre-oxygenation with 100% oxygen Intubation Type: IV induction, Rapid sequence and Cricoid Pressure applied Ventilation: Mask ventilation without difficulty Grade View: Grade II Tube type: Oral Tube size: 7.0 mm Number of attempts: 1 Airway Equipment and Method: stylet and Video-laryngoscopy Placement Confirmation: ETT inserted through vocal cords under direct vision,  positive ETCO2 and breath sounds checked- equal and bilateral Secured at: 26 cm Tube secured with: Tape Dental Injury: Teeth and Oropharynx as per pre-operative assessment  Difficulty Due To: Difficulty was anticipated

## 2016-08-28 NOTE — Anesthesia Postprocedure Evaluation (Signed)
Anesthesia Post Note  Patient: John [name]  Procedure(s) Performed: Procedure(s) (LRB): CYSTOSCOPYLEFT RETROGRADE PYELOGRAM, DIAGNOSITIC LEFT URETEROSCOPY (Left) CYSTOSCOPY WITH LEFT URETERAL STENT PLACEMENT (Left)  Patient location during evaluation: PACU Anesthesia Type: General Level of consciousness: awake, oriented and combative Pain management: pain level controlled Vital Signs Assessment: post-procedure vital signs reviewed and stable Respiratory status: spontaneous breathing, nonlabored ventilation and respiratory function stable Cardiovascular status: blood pressure returned to baseline and stable Postop Assessment: no signs of nausea or vomiting Anesthetic complications: no    Last Vitals:  Vitals:   08/28/16 1130 08/28/16 1322  BP: 125/90   Pulse:  87  Resp: (!) 23 17  Temp:  36.6 C    Last Pain:  Vitals:   08/28/16 1110  TempSrc: Oral                 Tove Wideman J

## 2016-08-28 NOTE — Anesthesia Preprocedure Evaluation (Signed)
Anesthesia Evaluation  Patient identified by MRN, date of birth, ID band Patient awake    Reviewed: Allergy & Precautions, NPO status , Patient's Chart, lab work & pertinent test results  Airway Mallampati: II  TM Distance: >3 FB Neck ROM: Full    Dental  (+) Poor Dentition, Missing, Chipped, Dental Advisory Given,    Pulmonary sleep apnea and Continuous Positive Airway Pressure Ventilation ,    breath sounds clear to auscultation       Cardiovascular hypertension, Pt. on medications  Rhythm:Regular Rate:Normal     Neuro/Psych Seizures -, Well Controlled,     GI/Hepatic GERD  Controlled and Medicated,  Endo/Other  Morbid obesity  Renal/GU Renal disease (Bright's Disease)     Musculoskeletal   Abdominal (+) + obese,   Peds  Hematology   Anesthesia Other Findings   Reproductive/Obstetrics                             Anesthesia Physical Anesthesia Plan  ASA: III  Anesthesia Plan: General   Post-op Pain Management:    Induction: Intravenous, Rapid sequence and Cricoid pressure planned  Airway Management Planned: Oral ETT  Additional Equipment:   Intra-op Plan:   Post-operative Plan: Extubation in OR  Informed Consent: I have reviewed the patients History and Physical, chart, labs and discussed the procedure including the risks, benefits and alternatives for the proposed anesthesia with the patient or authorized representative who has indicated his/her understanding and acceptance.     Plan Discussed with:   Anesthesia Plan Comments:         Anesthesia Quick Evaluation

## 2016-08-28 NOTE — H&P (Signed)
Urology Admission H&P  Chief Complaint: left flank pain  History of Present Illness: [name] is a 50yo with a hx of left 73mm ureteral calculus who has failed MET.  Past Medical History:  Diagnosis Date  . Arthritis   . Bright's disease    history of at age 89  . Cellulitis   . GERD (gastroesophageal reflux disease)   . History of kidney stones   . Hypertension   . MRSA (methicillin resistant Staphylococcus aureus)   . 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 no Seizures since then and on no meds.  . Sleep apnea    uses CPAP   Past Surgical History:  Procedure Laterality Date  . APPENDECTOMY    . KNEE ARTHROSCOPY Left    x2  . MOUTH SURGERY Left    jawx2' has plate and bone graft of jaw; graft was from hip.  Marland Kitchen SHOULDER SURGERY Left    trimmed cartiledge    Home Medications:  Prescriptions Prior to Admission  Medication Sig Dispense Refill Last Dose  . aspirin 325 MG tablet Take 325 mg by mouth daily.     Past Week at Unknown time  . cetirizine (ZYRTEC) 10 MG tablet Take 10 mg by mouth daily as needed for allergies.    08/27/2016 at Unknown time  . clobetasol (TEMOVATE) 0.05 % cream Apply 1 application topically 2 (two) times daily as needed (for cellulitis).    08/27/2016 at Unknown time  . Cyanocobalamin (VITAMIN B 12 PO) Take 1 tablet by mouth 3 (three) times a week.    08/27/2016 at Unknown time  . cyclobenzaprine (FLEXERIL) 10 MG tablet Take 10 mg by mouth 3 (three) times daily as needed for muscle spasms.    08/27/2016 at Unknown time  . diltiazem (CARDIZEM LA) 240 MG 24 hr tablet Take 240 mg by mouth daily.   08/27/2016 at Unknown time  . omeprazole (PRILOSEC) 40 MG capsule Take 40 mg by mouth daily as needed (for acid reflux).    08/27/2016 at Unknown time  . ondansetron (ZOFRAN ODT) 4 MG disintegrating tablet Take 1 tablet (4 mg total) by mouth every 8 (eight) hours as needed for nausea or vomiting. 20 tablet 0  08/27/2016 at Unknown time  . oxyCODONE-acetaminophen (PERCOCET/ROXICET) 5-325 MG tablet Take 1-2 tablets by mouth every 4 (four) hours as needed for severe pain. 15 tablet 0 08/27/2016 at Unknown time  . tamsulosin (FLOMAX) 0.4 MG CAPS capsule Take 1 capsule (0.4 mg total) by mouth daily. 30 capsule 0 08/28/2016 at 0900  . HYDROcodone-acetaminophen (NORCO/VICODIN) 5-325 MG tablet Take 1 tablet by mouth every 4 (four) hours as needed. (Patient not taking: Reported on 08/20/2016) 10 tablet 0 Not Taking at Unknown time   Allergies:  Allergies  Allergen Reactions  . Penicillins Rash and Other (See Comments)    Has patient had a PCN reaction causing immediate rash, facial/tongue/throat swelling, SOB or lightheadedness with hypotension: No Has patient had a PCN reaction causing severe rash involving mucus membranes or skin necrosis: No Has patient had a PCN reaction that required hospitalization No Has patient had a PCN reaction occurring within the last 10 years: No If all of the above answers are "NO", then may proceed with Cephalosporin use.     History reviewed. No pertinent family history. Social History:  reports that he has never smoked. He has never used smokeless tobacco. He reports that he drinks alcohol. He reports  that he does not use drugs.  Review of Systems  Genitourinary: Positive for flank pain.  All other systems reviewed and are negative.   Physical Exam:  Vital signs in last 24 hours: Temp:  [97.9 F (36.6 C)] 97.9 F (36.6 C) (10/11 1110) Resp:  [22-23] 23 (10/11 1130) BP: (125-174)/(90-106) 125/90 (10/11 1130) SpO2:  [93 %-94 %] 93 % (10/11 1115) Physical Exam  Constitutional: He is oriented to person, place, and time. He appears well-developed and well-nourished.  HENT:  Head: Normocephalic and atraumatic.  Eyes: EOM are normal. Pupils are equal, round, and reactive to light.  Neck: Normal range of motion. No thyromegaly present.  Cardiovascular: Normal rate  and regular rhythm.   Respiratory: Effort normal. No respiratory distress.  GI: Soft. He exhibits no distension.  Musculoskeletal: Normal range of motion. He exhibits no edema.  Neurological: He is alert and oriented to person, place, and time.  Skin: Skin is warm and dry.  Psychiatric: He has a normal mood and affect. His behavior is normal. Judgment and thought content normal.    Laboratory Data:  No results found for this or any previous visit (from the past 24 hour(s)). No results found for this or any previous visit (from the past 240 hour(s)). Creatinine: No results for input(s): CREATININE in the last 168 hours. Baseline Creatinine: unknown  Impression/Assessment:  50yo with left ureteral calculsu  Plan:  The risks/benefits/alternatives with left ureteroscopic stone extraction was explained to the patient and he understands and wishes to proceed with surgery  Nicolette Bang 08/28/2016, 12:14 PM

## 2016-08-28 NOTE — Transfer of Care (Signed)
Immediate Anesthesia Transfer of Care Note  Patient: Janice Coffin Mitch  Procedure(s) Performed: Procedure(s): CYSTOSCOPYLEFT RETROGRADE PYELOGRAM, DIAGNOSITIC LEFT URETEROSCOPY (Left) CYSTOSCOPY WITH LEFT URETERAL STENT PLACEMENT (Left)  Patient Location: PACU  Anesthesia Type:General  Level of Consciousness: awake and patient cooperative  Airway & Oxygen Therapy: Patient Spontanous Breathing and Patient connected to face mask oxygen  Post-op Assessment: Report given to RN, Post -op Vital signs reviewed and stable and Patient moving all extremities  Post vital signs: Reviewed and stable  Last Vitals:  Vitals:   08/28/16 1115 08/28/16 1130  BP: (!) 174/104 125/90  Resp: (!) 22 (!) 23  Temp:      Last Pain:  Vitals:   08/28/16 1110  TempSrc: Oral      Patients Stated Pain Goal: 8 ([id number])  Complications: No apparent anesthesia complications

## 2016-08-28 NOTE — Op Note (Signed)
Preoperative diagnosis: Left hydronephrosis  Postoperative diagnosis: Same  Procedure: 1 cystoscopy 2.  left retrograde pyelography 3.  Intraoperative fluoroscopy, under one hour, with interpretation 4.  Left diagnostic ureteroscopy 5. Left 6x26 JJ ureteral stent placement  Attending: Nicolette Bang  Anesthesia: General  Estimated blood loss: None  Drains: left 6x26 JJ ureteral stent  Specimens: none  Antibiotics: cipro  Findings: Left proximal ureteral calculus with proximal moderate hydronephrosis. No masses/lesions in bladder.  Indications: Patient is a 50 year old male with a history of left ureteral stone who has failed medical expulsive therapy.  After discussing treatment options, she decided proceed with right diagnostic ureteroscopy  Procedure her in detail: The patient was brought to the operating room and a brief timeout was done to ensure correct patient, correct procedure, correct site.  General anesthesia was administered patient was placed in dorsal lithotomy position.  Her genitalia was then prepped and draped in usual sterile fashion.  A rigid 66 French cystoscope was passed in the urethra and the bladder.  Bladder was inspected free masses or lesions.  The ureteral orifices were in the normal orthotopic locations.  a 6 french ureteral catheter was then instilled into the left ureter orifice.  a gentle retrograde was obtained and findings noted above.  we then placed a zip wire through the ureteral catheter and advanced up to the renal pelvis.  we then removed the cystoscope and cannulated the left ureteral orifice with a semirigid ureteroscope.  we then performed ureteroscopy up to the level of the proximal ureter but could not advance the scope any further due to the caliber of the ureter. No stone or tumor was encountered. We then placed a 6x26 JJ ureteral stent over the original zipwire. We removed the wire and good coil was noted in the bladder and renal pelvis under  fluoroscopy. the bladder was then drained and this concluded the procedure which was well tolerated by patient.  Complications: None  Condition: Stable, extubated, transferred to PACU  Plan: Pt is to followup in 2 weeks for ureteroscopic stone extraction

## 2016-08-28 NOTE — Discharge Instructions (Signed)
Cystoscopy, Care After Refer to this sheet in the next few weeks. These instructions provide you with information on caring for yourself after your procedure. Your caregiver may also give you more specific instructions. Your treatment has been planned according to current medical practices, but problems sometimes occur. Call your caregiver if you have any problems or questions after your procedure. HOME CARE INSTRUCTIONS  Things you can do to ease any discomfort after your procedure include:  Drinking enough water and fluids to keep your urine clear or pale yellow.  Taking a warm bath to relieve any burning feelings. SEEK IMMEDIATE MEDICAL CARE IF:   You have an increase in blood in your urine.  You notice blood clots in your urine.  You have difficulty passing urine.  You have the chills.  You have abdominal pain.  You have a fever or persistent symptoms for more than 2-3 days.  You have a fever and your symptoms suddenly get worse. MAKE SURE YOU:   Understand these instructions.  Will watch your condition.  Will get help right away if you are not doing well or get worse.   This information is not intended to replace advice given to you by your health care provider. Make sure you discuss any questions you have with your health care provider.   Document Released: 05/24/2005 Document Revised: 11/25/2014 Document Reviewed: 04/27/2012 Elsevier Interactive Patient Education 2016 [employer]   Ureteral Stent Implantation, Care After Refer to this sheet in the next few weeks. These instructions provide you with information on caring for yourself after your procedure. Your health care provider may also give you more specific instructions. Your treatment has been planned according to current medical practices, but problems sometimes occur. Call your health care provider if you have any problems or questions after your procedure. WHAT TO EXPECT AFTER THE PROCEDURE You should be back  to normal activity within 48 hours after the procedure. Nausea and vomiting may occur and are commonly the result of anesthesia. It is common to experience sharp pain in the back or lower abdomen and penis with voiding. This is caused by movement of the ends of the stent with the act of urinating.It usually goes away within minutes after you have stopped urinating. HOME CARE INSTRUCTIONS Make sure to drink plenty of fluids. You may have small amounts of bleeding, causing your urine to be red. This is normal. Certain movements may trigger pain or a feeling that you need to urinate. You may be given medicines to prevent infection or bladder spasms. Be sure to take all medicines as directed. Only take over-the-counter or prescription medicines for pain, discomfort, or fever as directed by your health care provider. Do not take aspirin, as this can make bleeding worse. Your stent will be left in until the blockage is resolved. This may take 2 weeks or longer, depending on the reason for stent implantation. You may have an X-ray exam to make sure your ureter is open and that the stent has not moved out of position (migrated). The stent can be removed by your health care provider in the office. Medicines may be given for comfort while the stent is being removed. Be sure to keep all follow-up appointments so your health care provider can check that you are healing properly. SEEK MEDICAL CARE IF:  You experience increasing pain.  Your pain medicine is not working. SEEK IMMEDIATE MEDICAL CARE IF:  Your urine is dark red or has blood clots.  You are leaking  urine (incontinent).  You have a fever, chills, feeling sick to your stomach (nausea), or vomiting.  Your pain is not relieved by pain medicine.  The end of the stent comes out of the urethra.  You are unable to urinate.   This information is not intended to replace advice given to you by your health care provider. Make sure you discuss any  questions you have with your health care provider.   Document Released: 07/07/2013 Document Revised: 11/09/2013 Document Reviewed: 05/19/2015 Elsevier Interactive Patient Education 2016 [employer] Inc[employer]   PATIENT INSTRUCTIONS POST-ANESTHESIA  IMMEDIATELY FOLLOWING SURGERY:  Do not drive or operate machinery for the first twenty four hours after surgery.  Do not make any important decisions for twenty four hours after surgery or while taking narcotic pain medications or sedatives.  If you develop intractable nausea and vomiting or a severe headache please notify your doctor immediately.  FOLLOW-UP:  Please make an appointment with your surgeon as instructed. You do not need to follow up with anesthesia unless specifically instructed to do so.  WOUND CARE INSTRUCTIONS (if applicable):  Keep a dry clean dressing on the anesthesia/puncture wound site if there is drainage.  Once the wound has quit draining you may leave it open to air.  Generally you should leave the bandage intact for twenty four hours unless there is drainage.  If the epidural site drains for more than 36-48 hours please call the anesthesia department.  QUESTIONS?:  Please feel free to call your physician or the hospital operator if you have any questions, and they will be happy to assist you.     FOLLOW UP IN 1 WEEK FOR URETEROSCOPIC STONE EXTRACTION

## 2016-08-30 ENCOUNTER — Encounter (HOSPITAL_COMMUNITY): Payer: Self-pay | Admitting: Urology

## 2016-09-04 ENCOUNTER — Ambulatory Visit (INDEPENDENT_AMBULATORY_CARE_PROVIDER_SITE_OTHER): Payer: Self-pay | Admitting: Urology

## 2016-09-04 DIAGNOSIS — N201 Calculus of ureter: Secondary | ICD-10-CM

## 2016-09-12 ENCOUNTER — Other Ambulatory Visit: Payer: Self-pay | Admitting: Radiology

## 2016-09-12 ENCOUNTER — Telehealth: Payer: Self-pay | Admitting: Radiology

## 2016-09-12 DIAGNOSIS — N201 Calculus of ureter: Secondary | ICD-10-CM

## 2016-09-12 NOTE — Telephone Encounter (Signed)
Notified pt of surgery scheduled with Dr Alyson Ingles at [facility] on 09/18/16, pre-op appt at [facility] on 09/16/16 @10 :00 & per Dr Alyson Ingles he may continue ASA prior to surgery. Pt voices understanding.

## 2016-09-13 NOTE — Patient Instructions (Signed)
John [name]  09/13/2016     @PREFPERIOPPHARMACY @   Your procedure is scheduled on 09/18/2016.  Report to [facility] at 7:15 A.M.  Call this number if you have problems the morning of surgery:  [phone]   Remember:  Do not eat food or drink liquids after midnight.  Take these medicines the morning of surgery with A SIP OF WATER: ZYRTEC, FLEXERIL, CARDIZEM, PRILOSEC AND PERCOCET   Do not wear jewelry, make-up or nail polish.  Do not wear lotions, powders, or perfumes, or deoderant.  Do not shave 48 hours prior to surgery.  Men may shave face and neck.  Do not bring valuables to the hospital.  [facility] is not responsible for any belongings or valuables.  Contacts, dentures or bridgework may not be worn into surgery.  Leave your suitcase in the car.  After surgery it may be brought to your room.  For patients admitted to the hospital, discharge time will be determined by your treatment team.  Patients discharged the day of surgery will not be allowed to drive home.   Name and phone number of your driver:   FAMILY Special instructions:  N/A  Please read over the following fact sheets that you were given. Care and Recovery After Surgery   PATIENT INSTRUCTIONS POST-ANESTHESIA  IMMEDIATELY FOLLOWING SURGERY:  Do not drive or operate machinery for the first twenty four hours after surgery.  Do not make any important decisions for twenty four hours after surgery or while taking narcotic pain medications or sedatives.  If you develop intractable nausea and vomiting or a severe headache please notify your doctor immediately.  FOLLOW-UP:  Please make an appointment with your surgeon as instructed. You do not need to follow up with anesthesia unless specifically instructed to do so.  WOUND CARE INSTRUCTIONS (if applicable):  Keep a dry clean dressing on the anesthesia/puncture wound site if there is drainage.  Once the wound has quit draining you may leave it open to air.   Generally you should leave the bandage intact for twenty four hours unless there is drainage.  If the epidural site drains for more than 36-48 hours please call the anesthesia department.  QUESTIONS?:  Please feel free to call your physician or the hospital operator if you have any questions, and they will be happy to assist you.       Cystoscopy Cystoscopy is a procedure that is used to help your caregiver diagnose and sometimes treat conditions that affect your lower urinary tract. Your lower urinary tract includes your bladder and the tube through which urine passes from your bladder out of your body (urethra). Cystoscopy is performed with a thin, tube-shaped instrument (cystoscope). The cystoscope has lenses and a light at the end so that your caregiver can see inside your bladder. The cystoscope is inserted at the entrance of your urethra. Your caregiver guides it through your urethra and into your bladder. There are two main types of cystoscopy:  Flexible cystoscopy (with a flexible cystoscope).  Rigid cystoscopy (with a rigid cystoscope). Cystoscopy may be recommended for many conditions, including:  Urinary tract infections.  Blood in your urine (hematuria).  Loss of bladder control (urinary incontinence) or overactive bladder.  Unusual cells found in a urine sample.  Urinary blockage.  Painful urination. Cystoscopy may also be done to remove a sample of your tissue to be checked under a microscope (biopsy). It may also be done to remove or destroy bladder stones. LET YOUR CAREGIVER  KNOW ABOUT:  Allergies to food or medicine.  Medicines taken, including vitamins, herbs, eyedrops, over-the-counter medicines, and creams.  Use of steroids (by mouth or creams).  Previous problems with anesthetics or numbing medicines.  History of bleeding problems or blood clots.  Previous surgery.  Other health problems, including diabetes and kidney problems.  Possibility of pregnancy,  if this applies. PROCEDURE The area around the opening to your urethra will be cleaned. A medicine to numb your urethra (local anesthetic) is used. If a tissue sample or stone is removed during the procedure, you may be given a medicine to make you sleep (general anesthetic). Your caregiver will gently insert the tip of the cystoscope into your urethra. The cystoscope will be slowly glided through your urethra and into your bladder. Sterile fluid will flow through the cystoscope and into your bladder. The fluid will expand and stretch your bladder. This gives your caregiver a better view of your bladder walls. The procedure lasts about 15-20 minutes. AFTER THE PROCEDURE If a local anesthetic is used, you will be allowed to go home as soon as you are ready. If a general anesthetic is used, you will be taken to a recovery area until you are stable. You may have temporary bleeding and burning on urination.   This information is not intended to replace advice given to you by your health care provider. Make sure you discuss any questions you have with your health care provider.   Document Released: 11/01/2000 Document Revised: 11/25/2014 Document Reviewed: 04/27/2012 Elsevier Interactive Patient Education [employer].

## 2016-09-16 ENCOUNTER — Encounter (HOSPITAL_COMMUNITY): Payer: Self-pay

## 2016-09-16 ENCOUNTER — Encounter (HOSPITAL_COMMUNITY)
Admission: RE | Admit: 2016-09-16 | Discharge: 2016-09-16 | Disposition: A | Discharge status: Home / Self Care | Payer: Medicare Other | Source: Ambulatory Visit | Attending: Urology | Admitting: Urology

## 2016-09-16 DIAGNOSIS — N201 Calculus of ureter: Secondary | ICD-10-CM | POA: Diagnosis not present

## 2016-09-16 DIAGNOSIS — G4730 Sleep apnea, unspecified: Secondary | ICD-10-CM | POA: Diagnosis not present

## 2016-09-16 DIAGNOSIS — Z87442 Personal history of urinary calculi: Secondary | ICD-10-CM | POA: Diagnosis not present

## 2016-09-16 DIAGNOSIS — Z6841 Body Mass Index (BMI) 40.0 and over, adult: Secondary | ICD-10-CM | POA: Diagnosis not present

## 2016-09-16 DIAGNOSIS — Z7982 Long term (current) use of aspirin: Secondary | ICD-10-CM | POA: Diagnosis not present

## 2016-09-16 DIAGNOSIS — K219 Gastro-esophageal reflux disease without esophagitis: Secondary | ICD-10-CM | POA: Diagnosis not present

## 2016-09-16 DIAGNOSIS — Z79899 Other long term (current) drug therapy: Secondary | ICD-10-CM | POA: Diagnosis not present

## 2016-09-16 DIAGNOSIS — I10 Essential (primary) hypertension: Secondary | ICD-10-CM | POA: Diagnosis not present

## 2016-09-16 LAB — CBC
HCT: 43.8 % (ref 39.0–52.0)
Hemoglobin: 14.4 g/dL (ref 13.0–17.0)
MCH: 28.9 pg (ref 26.0–34.0)
MCHC: 32.9 g/dL (ref 30.0–36.0)
MCV: 88 fL (ref 78.0–100.0)
Platelets: 179 10*3/uL (ref 150–400)
RBC: 4.98 MIL/uL (ref 4.22–5.81)
RDW: 14.2 % (ref 11.5–15.5)
WBC: 5.7 10*3/uL (ref 4.0–10.5)

## 2016-09-16 LAB — BASIC METABOLIC PANEL
ANION GAP: 5 (ref 5–15)
BUN: 21 mg/dL — ABNORMAL HIGH (ref 6–20)
CHLORIDE: 104 mmol/L (ref 101–111)
CO2: 27 mmol/L (ref 22–32)
Calcium: 8.3 mg/dL — ABNORMAL LOW (ref 8.9–10.3)
Creatinine, Ser: 1.55 mg/dL — ABNORMAL HIGH (ref 0.61–1.24)
GFR calc non Af Amer: 50 mL/min — ABNORMAL LOW (ref >60)
GFR, EST AFRICAN AMERICAN: 58 mL/min — AB (ref >60)
Glucose, Bld: 153 mg/dL — ABNORMAL HIGH (ref 65–99)
Potassium: 4.4 mmol/L (ref 3.5–5.1)
Sodium: 136 mmol/L (ref 135–145)

## 2016-09-18 ENCOUNTER — Encounter (HOSPITAL_COMMUNITY): Payer: Self-pay | Admitting: Anesthesiology

## 2016-09-18 ENCOUNTER — Ambulatory Visit (HOSPITAL_COMMUNITY): Payer: Medicare Other | Admitting: Anesthesiology

## 2016-09-18 ENCOUNTER — Encounter (HOSPITAL_COMMUNITY)
Admission: RE | Disposition: A | Discharge status: Home / Self Care | Payer: Self-pay | Source: Ambulatory Visit | Attending: Urology

## 2016-09-18 ENCOUNTER — Ambulatory Visit (HOSPITAL_COMMUNITY): Payer: Medicare Other

## 2016-09-18 ENCOUNTER — Ambulatory Visit (HOSPITAL_COMMUNITY)
Admission: RE | Admit: 2016-09-18 | Discharge: 2016-09-18 | Disposition: A | Discharge status: Home / Self Care | Payer: Medicare Other | Source: Ambulatory Visit | Attending: Urology | Admitting: Urology

## 2016-09-18 DIAGNOSIS — K219 Gastro-esophageal reflux disease without esophagitis: Secondary | ICD-10-CM | POA: Diagnosis not present

## 2016-09-18 DIAGNOSIS — Z7982 Long term (current) use of aspirin: Secondary | ICD-10-CM | POA: Insufficient documentation

## 2016-09-18 DIAGNOSIS — Z87442 Personal history of urinary calculi: Secondary | ICD-10-CM | POA: Insufficient documentation

## 2016-09-18 DIAGNOSIS — G4730 Sleep apnea, unspecified: Secondary | ICD-10-CM | POA: Insufficient documentation

## 2016-09-18 DIAGNOSIS — N201 Calculus of ureter: Secondary | ICD-10-CM | POA: Insufficient documentation

## 2016-09-18 DIAGNOSIS — I10 Essential (primary) hypertension: Secondary | ICD-10-CM | POA: Insufficient documentation

## 2016-09-18 DIAGNOSIS — Z6841 Body Mass Index (BMI) 40.0 and over, adult: Secondary | ICD-10-CM | POA: Insufficient documentation

## 2016-09-18 DIAGNOSIS — Z79899 Other long term (current) drug therapy: Secondary | ICD-10-CM | POA: Insufficient documentation

## 2016-09-18 HISTORY — PX: HOLMIUM LASER APPLICATION: SHX5852

## 2016-09-18 HISTORY — PX: CYSTOSCOPY/RETROGRADE/URETEROSCOPY/STONE EXTRACTION WITH BASKET: SHX5317

## 2016-09-18 HISTORY — PX: CYSTOSCOPY WITH STENT PLACEMENT: SHX5790

## 2016-09-18 SURGERY — CYSTOSCOPY, WITH CALCULUS REMOVAL USING BASKET
Anesthesia: General | Site: Ureter | Laterality: Left

## 2016-09-18 MED ORDER — MIDAZOLAM HCL 2 MG/2ML IJ SOLN
1.0000 mg | INTRAMUSCULAR | Status: DC | PRN
  Administered 2016-09-18 (×2): 2 mg via INTRAVENOUS
  Filled 2016-09-18: qty 2

## 2016-09-18 MED ORDER — PROPOFOL 10 MG/ML IV BOLUS
INTRAVENOUS | Status: AC
  Filled 2016-09-18: qty 40

## 2016-09-18 MED ORDER — ROCURONIUM BROMIDE 50 MG/5ML IV SOLN
INTRAVENOUS | Status: AC
Start: 2016-09-18 — End: 2016-09-18
  Filled 2016-09-18: qty 1

## 2016-09-18 MED ORDER — SUCCINYLCHOLINE CHLORIDE 20 MG/ML IJ SOLN
INTRAMUSCULAR | Status: AC
  Filled 2016-09-18: qty 1

## 2016-09-18 MED ORDER — GLYCOPYRROLATE 0.2 MG/ML IJ SOLN
INTRAMUSCULAR | Status: AC
  Filled 2016-09-18: qty 1

## 2016-09-18 MED ORDER — DIATRIZOATE MEGLUMINE 30 % UR SOLN
URETHRAL | Status: DC | PRN
  Administered 2016-09-18: 100 mL via URETHRAL

## 2016-09-18 MED ORDER — FENTANYL CITRATE (PF) 250 MCG/5ML IJ SOLN
INTRAMUSCULAR | Status: AC
  Filled 2016-09-18: qty 5

## 2016-09-18 MED ORDER — EPHEDRINE SULFATE 50 MG/ML IJ SOLN
INTRAMUSCULAR | Status: DC | PRN
  Administered 2016-09-18: 10 mg via INTRAVENOUS
  Administered 2016-09-18 (×2): 5 mg via INTRAVENOUS

## 2016-09-18 MED ORDER — CIPROFLOXACIN IN D5W 400 MG/200ML IV SOLN
400.0000 mg | INTRAVENOUS | Status: AC
  Administered 2016-09-18: 400 mg via INTRAVENOUS
  Filled 2016-09-18: qty 200

## 2016-09-18 MED ORDER — GLYCOPYRROLATE 0.2 MG/ML IJ SOLN
0.2000 mg | Freq: Once | INTRAMUSCULAR | Status: AC
  Administered 2016-09-18: 0.2 mg via INTRAVENOUS

## 2016-09-18 MED ORDER — ARTIFICIAL TEARS OP OINT
TOPICAL_OINTMENT | OPHTHALMIC | Status: AC
  Filled 2016-09-18: qty 7

## 2016-09-18 MED ORDER — ROCURONIUM 10MG/ML (10ML) SYRINGE FOR MEDFUSION PUMP - OPTIME
INTRAVENOUS | Status: DC | PRN
  Administered 2016-09-18: 8 mg via INTRAVENOUS

## 2016-09-18 MED ORDER — DIATRIZOATE MEGLUMINE 30 % UR SOLN
URETHRAL | Status: AC
  Filled 2016-09-18: qty 300

## 2016-09-18 MED ORDER — DEXAMETHASONE SODIUM PHOSPHATE 4 MG/ML IJ SOLN
4.0000 mg | Freq: Once | INTRAMUSCULAR | Status: AC
  Administered 2016-09-18: 4 mg via INTRAVENOUS

## 2016-09-18 MED ORDER — HYDROMORPHONE HCL 1 MG/ML IJ SOLN
0.2500 mg | INTRAMUSCULAR | Status: DC | PRN
  Administered 2016-09-18: 0.5 mg via INTRAVENOUS
  Filled 2016-09-18: qty 0.5

## 2016-09-18 MED ORDER — SUCCINYLCHOLINE 20MG/ML (10ML) SYRINGE FOR MEDFUSION PUMP - OPTIME
INTRAMUSCULAR | Status: DC | PRN
  Administered 2016-09-18: 1 mg via INTRAVENOUS

## 2016-09-18 MED ORDER — SODIUM CHLORIDE 0.9 % IR SOLN
Status: DC | PRN
  Administered 2016-09-18: 3000 mL

## 2016-09-18 MED ORDER — LIDOCAINE HCL (CARDIAC) 10 MG/ML IV SOLN
INTRAVENOUS | Status: DC | PRN
  Administered 2016-09-18: 30 mg via INTRAVENOUS

## 2016-09-18 MED ORDER — DEXAMETHASONE SODIUM PHOSPHATE 4 MG/ML IJ SOLN
INTRAMUSCULAR | Status: AC
  Filled 2016-09-18: qty 1

## 2016-09-18 MED ORDER — SULFAMETHOXAZOLE-TRIMETHOPRIM 800-160 MG PO TABS: 1.0000 | ORAL_TABLET | Freq: Two times a day (BID) | ORAL | 0 refills | Status: DC

## 2016-09-18 MED ORDER — PROPOFOL 10 MG/ML IV BOLUS
INTRAVENOUS | Status: DC | PRN
Start: 2016-09-18 — End: 2016-09-18
  Administered 2016-09-18: 200 mg via INTRAVENOUS

## 2016-09-18 MED ORDER — OXYCODONE-ACETAMINOPHEN 5-325 MG PO TABS: 1.0000 | ORAL_TABLET | ORAL | 0 refills | Status: DC | PRN

## 2016-09-18 MED ORDER — ONDANSETRON HCL 4 MG/2ML IJ SOLN
INTRAMUSCULAR | Status: AC
  Filled 2016-09-18: qty 2

## 2016-09-18 MED ORDER — FENTANYL CITRATE (PF) 100 MCG/2ML IJ SOLN
INTRAMUSCULAR | Status: DC | PRN
  Administered 2016-09-18 (×2): 50 ug via INTRAVENOUS

## 2016-09-18 MED ORDER — LIDOCAINE HCL (PF) 1 % IJ SOLN
INTRAMUSCULAR | Status: AC
  Filled 2016-09-18: qty 5

## 2016-09-18 MED ORDER — ONDANSETRON HCL 4 MG/2ML IJ SOLN
4.0000 mg | Freq: Once | INTRAMUSCULAR | Status: AC
  Administered 2016-09-18: 4 mg via INTRAVENOUS

## 2016-09-18 MED ORDER — MIDAZOLAM HCL 2 MG/2ML IJ SOLN
INTRAMUSCULAR | Status: AC
  Filled 2016-09-18: qty 2

## 2016-09-18 MED ORDER — LACTATED RINGERS IV SOLN
INTRAVENOUS | Status: DC
  Administered 2016-09-18: 08:00:00 via INTRAVENOUS

## 2016-09-18 SURGICAL SUPPLY — 26 items
BAG DRAIN URO TABLE W/ADPT NS (DRAPE) ×3 IMPLANT
BAG DRN 8 ADPR NS SKTRN CSTL (DRAPE) ×1
BAG HAMPER (MISCELLANEOUS) ×3 IMPLANT
CLOTH BEACON ORANGE TIMEOUT ST (SAFETY) ×3 IMPLANT
EXTRACTOR STONE NITINOL NGAGE (UROLOGICAL SUPPLIES) ×3 IMPLANT
FIBER LASER FLEXIVA 200 (UROLOGICAL SUPPLIES) ×3 IMPLANT
GLOVE BIO SURGEON STRL SZ8 (GLOVE) ×3 IMPLANT
GLOVE BIOGEL M 8.0 STRL (GLOVE) ×3 IMPLANT
GLOVE BIOGEL PI IND STRL 6.5 (GLOVE) IMPLANT
GLOVE BIOGEL PI INDICATOR 6.5 (GLOVE) ×2
GLOVE ECLIPSE 6.5 STRL STRAW (GLOVE) ×2 IMPLANT
GLOVE EXAM NITRILE MD LF STRL (GLOVE) ×2 IMPLANT
GOWN STRL REUS W/ TWL LRG LVL3 (GOWN DISPOSABLE) ×1 IMPLANT
GOWN STRL REUS W/ TWL XL LVL3 (GOWN DISPOSABLE) ×1 IMPLANT
GOWN STRL REUS W/TWL LRG LVL3 (GOWN DISPOSABLE) ×9 IMPLANT
GOWN STRL REUS W/TWL XL LVL3 (GOWN DISPOSABLE) ×3
GUIDEWIRE STR DUAL SENSOR (WIRE) ×2 IMPLANT
GUIDEWIRE STR ZIPWIRE 035X150 (MISCELLANEOUS) ×3 IMPLANT
IV NS IRRIG 3000ML ARTHROMATIC (IV SOLUTION) ×6 IMPLANT
KIT ROOM TURNOVER AP CYSTO (KITS) ×3 IMPLANT
MANIFOLD NEPTUNE II (INSTRUMENTS) ×3 IMPLANT
PACK CYSTO (CUSTOM PROCEDURE TRAY) ×3 IMPLANT
PAD ARMBOARD 7.5X6 YLW CONV (MISCELLANEOUS) ×3 IMPLANT
SHEATH ACCESS URETERAL 38CM (SHEATH) ×2 IMPLANT
STENT URET 6FRX26 CONTOUR (STENTS) ×2 IMPLANT
SYRINGE 10CC LL (SYRINGE) ×3 IMPLANT

## 2016-09-18 NOTE — Addendum Note (Signed)
Addendum  created 09/18/16 1103 by Ollen Bowl, CRNA   Anesthesia Intra Flowsheets edited

## 2016-09-18 NOTE — Anesthesia Postprocedure Evaluation (Signed)
Anesthesia Post Note  Patient: John [name]  Procedure(s) Performed: Procedure(s) (LRB): CYSTOSCOPY/RETROGRADE/URETEROSCOPY/STONE EXTRACTION WITH BASKET (Left) CYSTOSCOPY WITH STENT PLACEMENT (Left) HOLMIUM LASER APPLICATION (Left)  Patient location during evaluation: Short Stay Anesthesia Type: General Level of consciousness: awake and alert and oriented Pain management: pain level controlled Vital Signs Assessment: post-procedure vital signs reviewed and stable Respiratory status: spontaneous breathing Cardiovascular status: blood pressure returned to baseline Postop Assessment: no signs of nausea or vomiting Anesthetic complications: no    Last Vitals:  Vitals:   09/18/16 1030 09/18/16 1040  BP: 111/75   Pulse: 89 77  Resp: 19 10  Temp:      Last Pain:  Vitals:   09/18/16 1040  TempSrc:   PainSc: 4                  Spero Gunnels

## 2016-09-18 NOTE — Transfer of Care (Signed)
Immediate Anesthesia Transfer of Care Note  Patient: John [name]  Procedure(s) Performed: Procedure(s): CYSTOSCOPY/RETROGRADE/URETEROSCOPY/STONE EXTRACTION WITH BASKET (Left) CYSTOSCOPY WITH STENT PLACEMENT (Left) HOLMIUM LASER APPLICATION (Left)  Patient Location: PACU  Anesthesia Type:General  Level of Consciousness: awake and alert   Airway & Oxygen Therapy: Patient Spontanous Breathing and Patient connected to face mask oxygen  Post-op Assessment: Report given to RN  Post vital signs: Reviewed and stable  Last Vitals:  Vitals:   09/18/16 0810 09/18/16 0820  BP: 128/82 110/78  Pulse:    Resp: 17 (!) 35  Temp:      Last Pain:  Vitals:   09/18/16 0742  TempSrc: Oral      Patients Stated Pain Goal: 8 ([phone] [id number])  Complications: No apparent anesthesia complications

## 2016-09-18 NOTE — Anesthesia Procedure Notes (Addendum)
Procedure Name: Intubation Date/Time: 09/18/2016 9:15 AM Performed by: Tressie Stalker E Pre-anesthesia Checklist: Patient identified, Patient being monitored, Timeout performed, Emergency Drugs available and Suction available Patient Re-evaluated:Patient Re-evaluated prior to inductionOxygen Delivery Method: Circle system utilized Preoxygenation: Pre-oxygenation with 100% oxygen Intubation Type: IV induction, Rapid sequence and Cricoid Pressure applied Ventilation: Mask ventilation without difficulty Laryngoscope Size: Glidescope and 4 Grade View: Grade I Tube type: Oral Tube size: 7.0 mm Number of attempts: 1 Airway Equipment and Method: Stylet Placement Confirmation: ETT inserted through vocal cords under direct vision,  positive ETCO2 and breath sounds checked- equal and bilateral Secured at: 22 cm Tube secured with: Tape Dental Injury: Teeth and Oropharynx as per pre-operative assessment

## 2016-09-18 NOTE — Anesthesia Preprocedure Evaluation (Signed)
Anesthesia Evaluation  Patient identified by MRN, date of birth, ID band Patient awake    Reviewed: Allergy & Precautions, NPO status , Patient's Chart, lab work & pertinent test results  Airway Mallampati: II  TM Distance: >3 FB Neck ROM: Full    Dental  (+) Poor Dentition, Missing, Chipped, Dental Advisory Given,    Pulmonary sleep apnea and Continuous Positive Airway Pressure Ventilation ,    breath sounds clear to auscultation       Cardiovascular hypertension, Pt. on medications  Rhythm:Regular Rate:Normal     Neuro/Psych Seizures -, Well Controlled,     GI/Hepatic GERD  Controlled and Medicated,  Endo/Other  Morbid obesity  Renal/GU Renal disease (Bright's Disease)     Musculoskeletal   Abdominal (+) + obese,   Peds  Hematology   Anesthesia Other Findings   Reproductive/Obstetrics                             Anesthesia Physical Anesthesia Plan  ASA: III  Anesthesia Plan: General   Post-op Pain Management:    Induction: Intravenous, Rapid sequence and Cricoid pressure planned  Airway Management Planned: Oral ETT and Video Laryngoscope Planned  Additional Equipment:   Intra-op Plan:   Post-operative Plan: Extubation in OR  Informed Consent: I have reviewed the patients History and Physical, chart, labs and discussed the procedure including the risks, benefits and alternatives for the proposed anesthesia with the patient or authorized representative who has indicated his/her understanding and acceptance.     Plan Discussed with: CRNA  Anesthesia Plan Comments:         Anesthesia Quick Evaluation

## 2016-09-18 NOTE — Discharge Instructions (Signed)
Follow up Appointment with Dr Alyson Ingles Nov 8th at 10 am  Cystoscopy, Care After Refer to this sheet in the next few weeks. These instructions provide you with information on caring for yourself after your procedure. Your caregiver may also give you more specific instructions. Your treatment has been planned according to current medical practices, but problems sometimes occur. Call your caregiver if you have any problems or questions after your procedure. HOME CARE INSTRUCTIONS  Things you can do to ease any discomfort after your procedure include:  Drinking enough water and fluids to keep your urine clear or pale yellow.  Taking a warm bath to relieve any burning feelings. SEEK IMMEDIATE MEDICAL CARE IF:   You have an increase in blood in your urine.  You notice blood clots in your urine.  You have difficulty passing urine.  You have the chills.  You have abdominal pain.  You have a fever or persistent symptoms for more than 2-3 days.  You have a fever and your symptoms suddenly get worse. MAKE SURE YOU:   Understand these instructions.  Will watch your condition.  Will get help right away if you are not doing well or get worse.   This information is not intended to replace advice given to you by your health care provider. Make sure you discuss any questions you have with your health care provider.   Document Released: 05/24/2005 Document Revised: 11/25/2014 Document Reviewed: 04/27/2012 Elsevier Interactive Patient Education 2016 [place] Anesthesia, Adult, Care After Refer to this sheet in the next few weeks. These instructions provide you with information on caring for yourself after your procedure. Your health care provider may also give you more specific instructions. Your treatment has been planned according to current medical practices, but problems sometimes occur. Call your health care provider if you have any problems or questions after your  procedure. WHAT TO EXPECT AFTER THE PROCEDURE After the procedure, it is typical to experience:  Sleepiness.  Nausea and vomiting. HOME CARE INSTRUCTIONS  For the first 24 hours after general anesthesia:  Have a responsible person with you.  Do not drive a car. If you are alone, do not take public transportation.  Do not drink alcohol.  Do not take medicine that has not been prescribed by your health care provider.  Do not sign important papers or make important decisions.  You may resume a normal diet and activities as directed by your health care provider.  Change bandages (dressings) as directed.  If you have questions or problems that seem related to general anesthesia, call the hospital and ask for the anesthetist or anesthesiologist on call. SEEK MEDICAL CARE IF:  You have nausea and vomiting that continue the day after anesthesia.  You develop a rash. SEEK IMMEDIATE MEDICAL CARE IF:   You have difficulty breathing.  You have chest pain.  You have any allergic problems.   This information is not intended to replace advice given to you by your health care provider. Make sure you discuss any questions you have with your health care provider.   Document Released: 02/10/2001 Document Revised: 11/25/2014 Document Reviewed: 03/04/2012 Elsevier Interactive Patient Education [employer].

## 2016-09-18 NOTE — Interval H&P Note (Signed)
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 After consideration of risks, benefits and other options for treatment, the patient has consented to  Procedure(s): CYSTOSCOPY/RETROGRADE/URETEROSCOPY/STONE EXTRACTION WITH BASKET (Left) CYSTOSCOPY WITH STENT PLACEMENT (Left) HOLMIUM LASER APPLICATION (Left) as a surgical intervention .  The patient's history has been reviewed, patient examined, no change in status, stable for surgery.  I have reviewed the patient's chart and labs.  Questions were answered to the patient's satisfaction.     Nicolette Bang

## 2016-09-18 NOTE — Addendum Note (Signed)
Addendum  created 09/18/16 1435 by Ollen Bowl, CRNA   Anesthesia Event edited

## 2016-09-18 NOTE — H&P (View-Only) (Signed)
Urology Admission H&P  Chief Complaint: left flank pain  History of Present Illness: [name] is a 50yo with a hx of left 79m ureteral calculus who has failed MET.  Past Medical History:  Diagnosis Date  . Arthritis   . Bright's disease    history of at age 50 . Cellulitis   . GERD (gastroesophageal reflux disease)   . History of kidney stones   . Hypertension   . MRSA (methicillin resistant Staphylococcus aureus)   . 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 no Seizures since then and on no meds.  . Sleep apnea    uses CPAP   Past Surgical History:  Procedure Laterality Date  . APPENDECTOMY    . KNEE ARTHROSCOPY Left    x2  . MOUTH SURGERY Left    jawx2' has plate and bone graft of jaw; graft was from hip.  .Marland KitchenSHOULDER SURGERY Left    trimmed cartiledge    Home Medications:  Prescriptions Prior to Admission  Medication Sig Dispense Refill Last Dose  . aspirin 325 MG tablet Take 325 mg by mouth daily.     Past Week at Unknown time  . cetirizine (ZYRTEC) 10 MG tablet Take 10 mg by mouth daily as needed for allergies.    08/27/2016 at Unknown time  . clobetasol (TEMOVATE) 0.05 % cream Apply 1 application topically 2 (two) times daily as needed (for cellulitis).    08/27/2016 at Unknown time  . Cyanocobalamin (VITAMIN B 12 PO) Take 1 tablet by mouth 3 (three) times a week.    08/27/2016 at Unknown time  . cyclobenzaprine (FLEXERIL) 10 MG tablet Take 10 mg by mouth 3 (three) times daily as needed for muscle spasms.    08/27/2016 at Unknown time  . diltiazem (CARDIZEM LA) 240 MG 24 hr tablet Take 240 mg by mouth daily.   08/27/2016 at Unknown time  . omeprazole (PRILOSEC) 40 MG capsule Take 40 mg by mouth daily as needed (for acid reflux).    08/27/2016 at Unknown time  . ondansetron (ZOFRAN ODT) 4 MG disintegrating tablet Take 1 tablet (4 mg total) by mouth every 8 (eight) hours as needed for nausea or vomiting. 20 tablet 0  08/27/2016 at Unknown time  . oxyCODONE-acetaminophen (PERCOCET/ROXICET) 5-325 MG tablet Take 1-2 tablets by mouth every 4 (four) hours as needed for severe pain. 15 tablet 0 08/27/2016 at Unknown time  . tamsulosin (FLOMAX) 0.4 MG CAPS capsule Take 1 capsule (0.4 mg total) by mouth daily. 30 capsule 0 08/28/2016 at 0900  . HYDROcodone-acetaminophen (NORCO/VICODIN) 5-325 MG tablet Take 1 tablet by mouth every 4 (four) hours as needed. (Patient not taking: Reported on 08/20/2016) 10 tablet 0 Not Taking at Unknown time   Allergies:  Allergies  Allergen Reactions  . Penicillins Rash and Other (See Comments)    Has patient had a PCN reaction causing immediate rash, facial/tongue/throat swelling, SOB or lightheadedness with hypotension: No Has patient had a PCN reaction causing severe rash involving mucus membranes or skin necrosis: No Has patient had a PCN reaction that required hospitalization No Has patient had a PCN reaction occurring within the last 10 years: No If all of the above answers are "NO", then may proceed with Cephalosporin use.     History reviewed. No pertinent family history. Social History:  reports that he has never smoked. He has never used smokeless tobacco. He reports that he drinks alcohol. He reports  that he does not use drugs.  Review of Systems  Genitourinary: Positive for flank pain.  All other systems reviewed and are negative.   Physical Exam:  Vital signs in last 24 hours: Temp:  [97.9 F (36.6 C)] 97.9 F (36.6 C) (10/11 1110) Resp:  [22-23] 23 (10/11 1130) BP: (125-174)/(90-106) 125/90 (10/11 1130) SpO2:  [93 %-94 %] 93 % (10/11 1115) Physical Exam  Constitutional: He is oriented to person, place, and time. He appears well-developed and well-nourished.  HENT:  Head: Normocephalic and atraumatic.  Eyes: EOM are normal. Pupils are equal, round, and reactive to light.  Neck: Normal range of motion. No thyromegaly present.  Cardiovascular: Normal rate  and regular rhythm.   Respiratory: Effort normal. No respiratory distress.  GI: Soft. He exhibits no distension.  Musculoskeletal: Normal range of motion. He exhibits no edema.  Neurological: He is alert and oriented to person, place, and time.  Skin: Skin is warm and dry.  Psychiatric: He has a normal mood and affect. His behavior is normal. Judgment and thought content normal.    Laboratory Data:  No results found for this or any previous visit (from the past 24 hour(s)). No results found for this or any previous visit (from the past 240 hour(s)). Creatinine: No results for input(s): CREATININE in the last 168 hours. Baseline Creatinine: unknown  Impression/Assessment:  50yo with left ureteral calculsu  Plan:  The risks/benefits/alternatives with left ureteroscopic stone extraction was explained to the patient and he understands and wishes to proceed with surgery  John [name] 08/28/2016, 12:14 PM

## 2016-09-18 NOTE — Brief Op Note (Signed)
09/18/2016  9:56 AM  PATIENT:  John [name]  50 y.o. male  PRE-OPERATIVE DIAGNOSIS:  left ureteral calculus  POST-OPERATIVE DIAGNOSIS:  left ureteral calculus  PROCEDURE:  Procedure(s): CYSTOSCOPY/RETROGRADE/URETEROSCOPY/STONE EXTRACTION WITH BASKET (Left) CYSTOSCOPY WITH STENT PLACEMENT (Left) HOLMIUM LASER APPLICATION (Left)  SURGEON:  Surgeon(s) and Role:    * Cleon Gustin, MD - Primary  PHYSICIAN ASSISTANT:   ASSISTANTS: none   ANESTHESIA:   general  EBL:  No intake/output data recorded.  BLOOD ADMINISTERED:none  DRAINS: lft 6x26 JJ stent with tether   LOCAL MEDICATIONS USED:  NONE  SPECIMEN:  Source of Specimen:  left renal calculus  DISPOSITION OF SPECIMEN:  PATHOLOGY  COUNTS:  YES  TOURNIQUET:  * No tourniquets in log *  DICTATION: .Note written in EPIC  PLAN OF CARE: Discharge to home after PACU  PATIENT DISPOSITION:  PACU - hemodynamically stable.   Delay start of Pharmacological VTE agent (>24hrs) due to surgical blood loss or risk of bleeding: not applicable

## 2016-09-20 ENCOUNTER — Encounter (HOSPITAL_COMMUNITY): Payer: Self-pay | Admitting: Urology

## 2016-09-25 ENCOUNTER — Ambulatory Visit (INDEPENDENT_AMBULATORY_CARE_PROVIDER_SITE_OTHER): Payer: Medicare Other | Admitting: Urology

## 2016-09-25 DIAGNOSIS — N201 Calculus of ureter: Secondary | ICD-10-CM | POA: Diagnosis not present

## 2016-09-30 LAB — STONE ANALYSIS
Ca Oxalate,Monohydr.: 95 %
Ca phos cry stone ql IR: 5 %
Stone Weight KSTONE: 69.9 mg

## 2016-10-17 ENCOUNTER — Other Ambulatory Visit: Payer: Self-pay | Admitting: Urology

## 2016-10-17 DIAGNOSIS — N201 Calculus of ureter: Secondary | ICD-10-CM

## 2016-10-22 ENCOUNTER — Ambulatory Visit (HOSPITAL_COMMUNITY)
Admission: RE | Admit: 2016-10-22 | Discharge: 2016-10-22 | Disposition: A | Discharge status: Home / Self Care | Payer: Medicare Other | Source: Ambulatory Visit | Attending: Urology | Admitting: Urology

## 2016-10-22 DIAGNOSIS — N200 Calculus of kidney: Secondary | ICD-10-CM | POA: Insufficient documentation

## 2016-10-22 DIAGNOSIS — N201 Calculus of ureter: Secondary | ICD-10-CM

## 2016-10-25 ENCOUNTER — Emergency Department (HOSPITAL_COMMUNITY): Payer: Medicare Other

## 2016-10-25 ENCOUNTER — Encounter (HOSPITAL_COMMUNITY): Payer: Self-pay | Admitting: Emergency Medicine

## 2016-10-25 ENCOUNTER — Emergency Department (HOSPITAL_COMMUNITY)
Admission: EM | Admit: 2016-10-25 | Discharge: 2016-10-25 | Disposition: A | Discharge status: Home / Self Care | Payer: Medicare Other | Attending: Emergency Medicine | Admitting: Emergency Medicine

## 2016-10-25 DIAGNOSIS — Z7982 Long term (current) use of aspirin: Secondary | ICD-10-CM | POA: Insufficient documentation

## 2016-10-25 DIAGNOSIS — J209 Acute bronchitis, unspecified: Secondary | ICD-10-CM

## 2016-10-25 DIAGNOSIS — I10 Essential (primary) hypertension: Secondary | ICD-10-CM | POA: Insufficient documentation

## 2016-10-25 DIAGNOSIS — R05 Cough: Secondary | ICD-10-CM

## 2016-10-25 DIAGNOSIS — R059 Cough, unspecified: Secondary | ICD-10-CM

## 2016-10-25 DIAGNOSIS — Z79899 Other long term (current) drug therapy: Secondary | ICD-10-CM | POA: Diagnosis not present

## 2016-10-25 MED ORDER — AZITHROMYCIN 250 MG PO TABS: 250.0000 mg | ORAL_TABLET | Freq: Every day | ORAL | 0 refills | Status: DC

## 2016-10-25 MED ORDER — BENZONATATE 100 MG PO CAPS: 100.0000 mg | ORAL_CAPSULE | Freq: Three times a day (TID) | ORAL | 0 refills | Status: DC

## 2016-10-25 NOTE — Discharge Instructions (Signed)
See your Physician for recheck next week  °

## 2016-10-25 NOTE — ED Provider Notes (Signed)
[facility] DEPT Provider Note   CSN: [id number] Arrival date & time: 10/25/16  1758     History   Chief Complaint Chief Complaint  Patient presents with  . Cough    HPI John [name] is a 50 y.o. male.  The history is provided by the patient. No language interpreter was used.  Cough  This is a new problem. The problem occurs constantly. The problem has been gradually worsening. The cough is productive of sputum. There has been no fever. Associated symptoms include rhinorrhea. He has tried nothing for the symptoms. The treatment provided no relief. He is not a smoker. His past medical history does not include pneumonia.  Pt reports he has had a cough for over a week.  Pt reports he has felt hot but he has not had a fever  Past Medical History:  Diagnosis Date  . Arthritis   . Bright's disease    history of at age 32  . Cellulitis   . GERD (gastroesophageal reflux disease)   . History of kidney stones   . Hypertension   . MRSA (methicillin resistant Staphylococcus aureus)   . 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 no Seizures since then and on no meds.  . Sleep apnea    uses CPAP    Patient Active Problem List   Diagnosis Date Noted  . OBESITY 03/29/2010  . CHEST PAIN 03/29/2010  . SLEEP APNEA 05/15/2009    Past Surgical History:  Procedure Laterality Date  . APPENDECTOMY    . CYSTOSCOPY WITH STENT PLACEMENT Left 08/28/2016   Procedure: CYSTOSCOPY WITH LEFT URETERAL STENT PLACEMENT;  Surgeon: Cleon Gustin, MD;  Location: [facility];  Service: Urology;  Laterality: Left;  . CYSTOSCOPY WITH STENT PLACEMENT Left 09/18/2016   Procedure: CYSTOSCOPY WITH STENT PLACEMENT;  Surgeon: Cleon Gustin, MD;  Location: [facility];  Service: Urology;  Laterality: Left;  . CYSTOSCOPY/RETROGRADE/URETEROSCOPY/STONE EXTRACTION WITH BASKET Left 08/28/2016   Procedure: CYSTOSCOPYLEFT RETROGRADE PYELOGRAM, DIAGNOSITIC LEFT  URETEROSCOPY;  Surgeon: Cleon Gustin, MD;  Location: [facility];  Service: Urology;  Laterality: Left;  . CYSTOSCOPY/RETROGRADE/URETEROSCOPY/STONE EXTRACTION WITH BASKET Left 09/18/2016   Procedure: CYSTOSCOPY/RETROGRADE/URETEROSCOPY/STONE EXTRACTION WITH BASKET;  Surgeon: Cleon Gustin, MD;  Location: [facility];  Service: Urology;  Laterality: Left;  . HOLMIUM LASER APPLICATION Left [id number]   Procedure: HOLMIUM LASER APPLICATION;  Surgeon: Cleon Gustin, MD;  Location: [facility];  Service: Urology;  Laterality: Left;  . KNEE ARTHROSCOPY Left    x2  . MOUTH SURGERY Left    jawx2' has plate and bone graft of jaw; graft was from hip.  Marland Kitchen SHOULDER SURGERY Left    trimmed cartiledge       Home Medications    Prior to Admission medications   Medication Sig Start Date End Date Taking? Authorizing Provider  aspirin 325 MG tablet Take 325 mg by mouth daily.      Historical Provider, MD  azithromycin (ZITHROMAX) 250 MG tablet Take 1 tablet (250 mg total) by mouth daily. Take first 2 tablets together, then 1 every day until finished. 10/25/16   Fransico Meadow, PA-C  benzonatate (TESSALON) 100 MG capsule Take 1 capsule (100 mg total) by mouth every 8 (eight) hours. 10/25/16   Fransico Meadow, PA-C  cetirizine (ZYRTEC) 10 MG tablet Take 10 mg by mouth daily as needed for allergies.     Historical Provider, MD  clobetasol (TEMOVATE) 0.05 %  cream Apply 1 application topically 2 (two) times daily as needed (for cellulitis).     Historical Provider, MD  Cyanocobalamin (VITAMIN B 12 PO) Take 1 tablet by mouth 3 (three) times a week.     Historical Provider, MD  cyclobenzaprine (FLEXERIL) 10 MG tablet Take 10 mg by mouth 3 (three) times daily as needed for muscle spasms.     Historical Provider, MD  diltiazem (CARDIZEM LA) 240 MG 24 hr tablet Take 240 mg by mouth daily.    Historical Provider, MD  omeprazole (PRILOSEC) 40 MG capsule Take 40 mg by mouth daily as needed (for acid reflux).  12/20/13    Historical Provider, MD  ondansetron (ZOFRAN ODT) 4 MG disintegrating tablet Take 1 tablet (4 mg total) by mouth every 8 (eight) hours as needed for nausea or vomiting. 07/14/16   Olivia Canter Sam, PA-C  oxyCODONE-acetaminophen (PERCOCET/ROXICET) 5-325 MG tablet Take 1-2 tablets by mouth every 4 (four) hours as needed for severe pain. 09/18/16   Cleon Gustin, MD  sulfamethoxazole-trimethoprim (BACTRIM DS,SEPTRA DS) 800-160 MG tablet Take 1 tablet by mouth 2 (two) times daily. 09/18/16   Cleon Gustin, MD  tamsulosin (FLOMAX) 0.4 MG CAPS capsule Take 1 capsule (0.4 mg total) by mouth daily. 08/28/16   Cleon Gustin, MD    Family History Family History  Problem Relation Age of Onset  . Diabetes Mother   . Cancer Mother   . Cancer Father   . Cancer Other     Social History Social History  Substance Use Topics  . Smoking status: Never Smoker  . Smokeless tobacco: Never Used  . Alcohol use Yes     Comment: occasionally     Allergies   Penicillins   Review of Systems Review of Systems  HENT: Positive for rhinorrhea.   Respiratory: Positive for cough.   All other systems reviewed and are negative.    Physical Exam Updated Vital Signs BP 158/98 (BP Location: Left Wrist)   Pulse 86   Temp 98.1 F (36.7 C) (Oral)   Resp 20   Ht 6' (1.829 m)   Wt (!) 183.7 kg   SpO2 93%   BMI 54.93 kg/m   Physical Exam  Constitutional: He appears well-developed and well-nourished.  HENT:  Head: Normocephalic and atraumatic.  Mouth/Throat: Oropharynx is clear and moist.  Eyes: Conjunctivae are normal.  Neck: Neck supple.  Cardiovascular: Normal rate and regular rhythm.   No murmur heard. Pulmonary/Chest: Effort normal and breath sounds normal. No respiratory distress.  Abdominal: Soft. There is no tenderness.  Musculoskeletal: He exhibits no edema.  Neurological: He is alert.  Skin: Skin is warm and dry.  Psychiatric: He has a normal mood and affect.  Nursing note and  vitals reviewed.    ED Treatments / Results  Labs (all labs ordered are listed, but only abnormal results are displayed) Labs Reviewed - No data to display  EKG  EKG Interpretation None       Radiology Dg Chest 2 View  Result Date: 10/25/2016 CLINICAL DATA:  Productive cough with dyspnea and generalized chest pain. EXAM: CHEST  2 VIEW COMPARISON:  CXR from 05/22/2012 and 03/28/2010 FINDINGS: Heart is top-normal in size. There is aortic atherosclerosis. Chronic elevation of the right hemidiaphragm versus eventration. Subsegmental atelectasis and/or scarring at the left lung base laterally. No acute osseous abnormality. Mild degenerative change of the visualized lower cervical spine. IMPRESSION: No active cardiopulmonary disease. Electronically Signed   By: Meredith Leeds.D.  On: 10/25/2016 18:47    Procedures Procedures (including critical care time)  Medications Ordered in ED Medications - No data to display   Initial Impression / Assessment and Plan / ED Course  I have reviewed the triage vital signs and the nursing notes.  Pertinent labs & imaging results that were available during my care of the patient were reviewed by me and considered in my medical decision making (see chart for details).  Clinical Course     I will treat for bronchitis.   Pt has had a cough for over a week.   Final Clinical Impressions(s) / ED Diagnoses   Final diagnoses:  Cough  Acute bronchitis, unspecified organism    New Prescriptions New Prescriptions   AZITHROMYCIN (ZITHROMAX) 250 MG TABLET    Take 1 tablet (250 mg total) by mouth daily. Take first 2 tablets together, then 1 every day until finished.   BENZONATATE (TESSALON) 100 MG CAPSULE    Take 1 capsule (100 mg total) by mouth every 8 (eight) hours.  An After Visit Summary was printed and given to the patient.   Fransico Meadow, PA-C 10/25/16 [place], DO 10/26/16 2255

## 2016-10-25 NOTE — ED Triage Notes (Signed)
Pt reports productive cough with brownish sputum at times, greenish at others. Pt states symptoms started over a week ago.

## 2016-11-06 ENCOUNTER — Ambulatory Visit: Payer: Medicare Other | Admitting: Urology

## 2017-05-05 IMAGING — DX DG CHEST 2V
2 series · 2 of 2 positions shown · non-contrast
Comparison: CXR from 05/22/2012 and 03/28/2010

CLINICAL DATA: Productive cough with dyspnea and generalized chest
pain.

EXAM:
CHEST  2 VIEW

[chest pa]
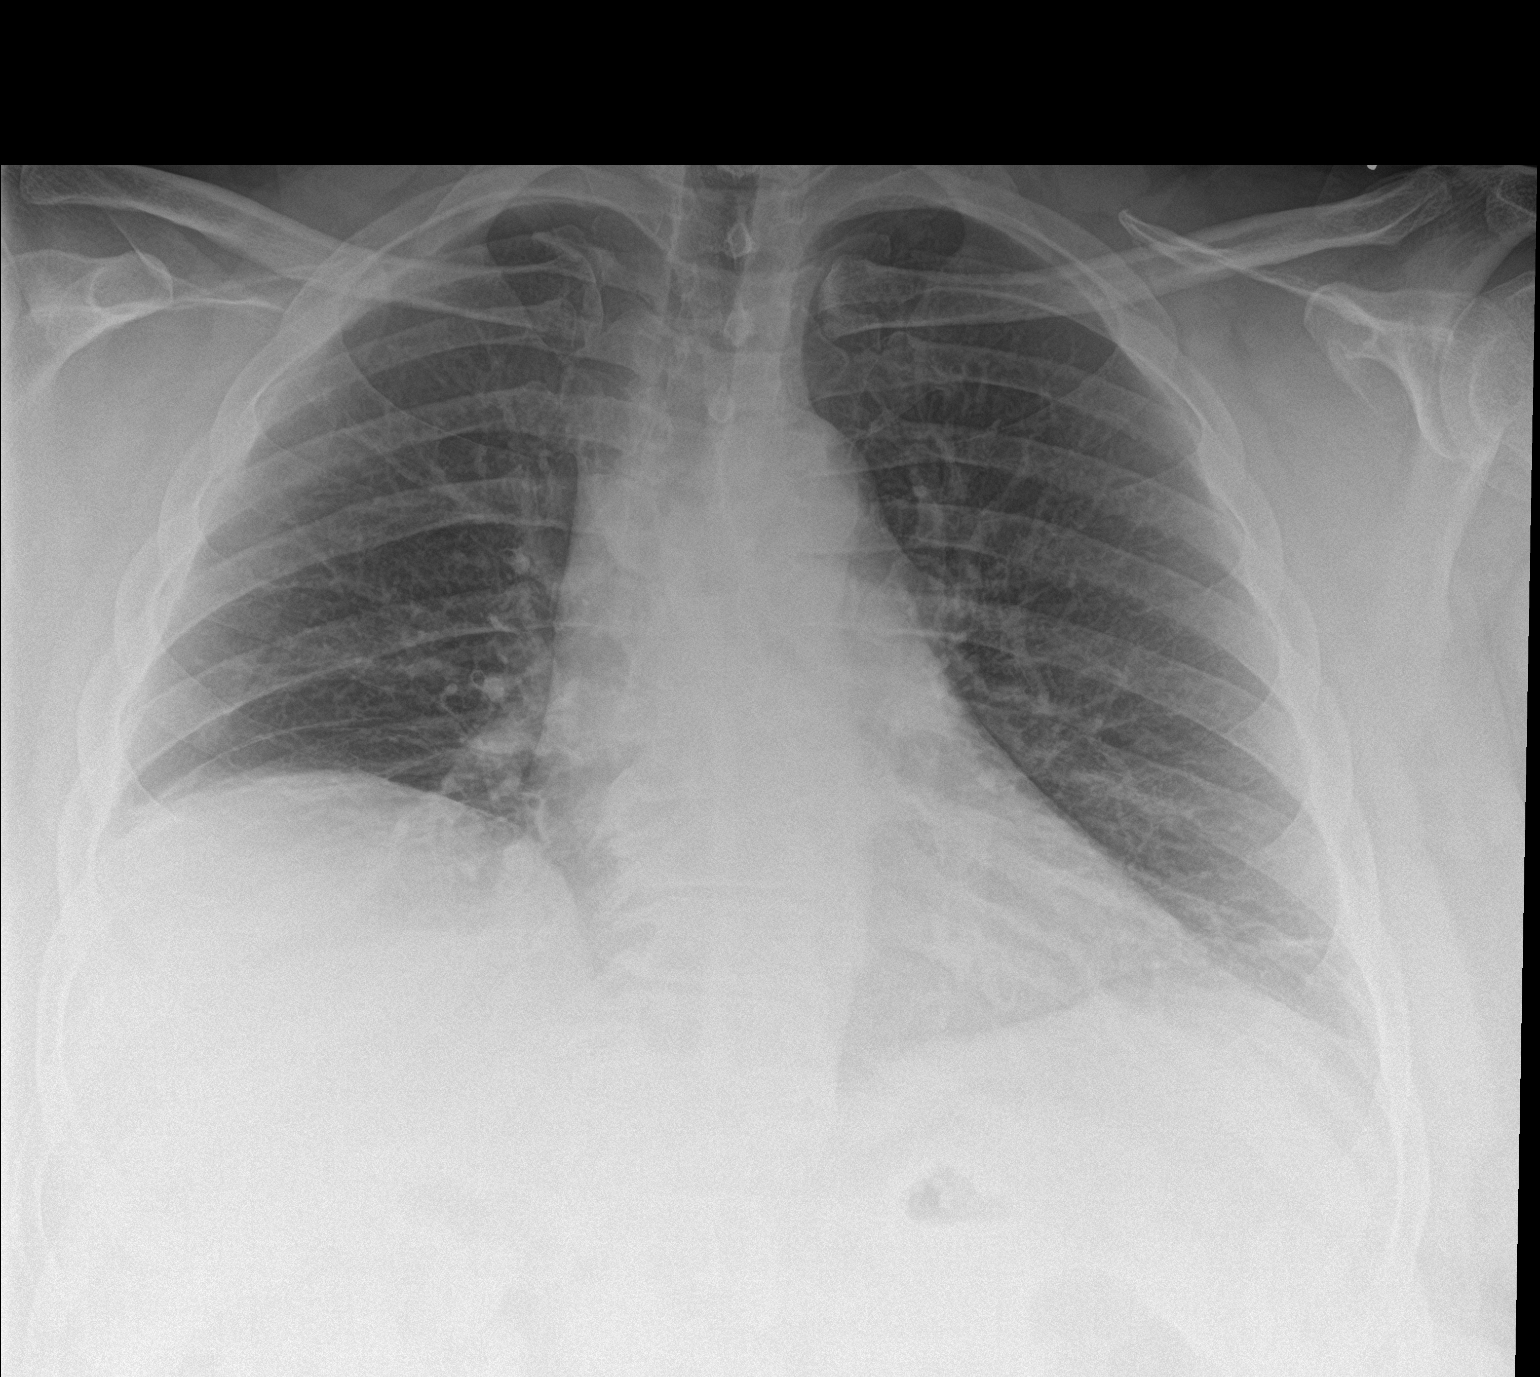

[chest lat]
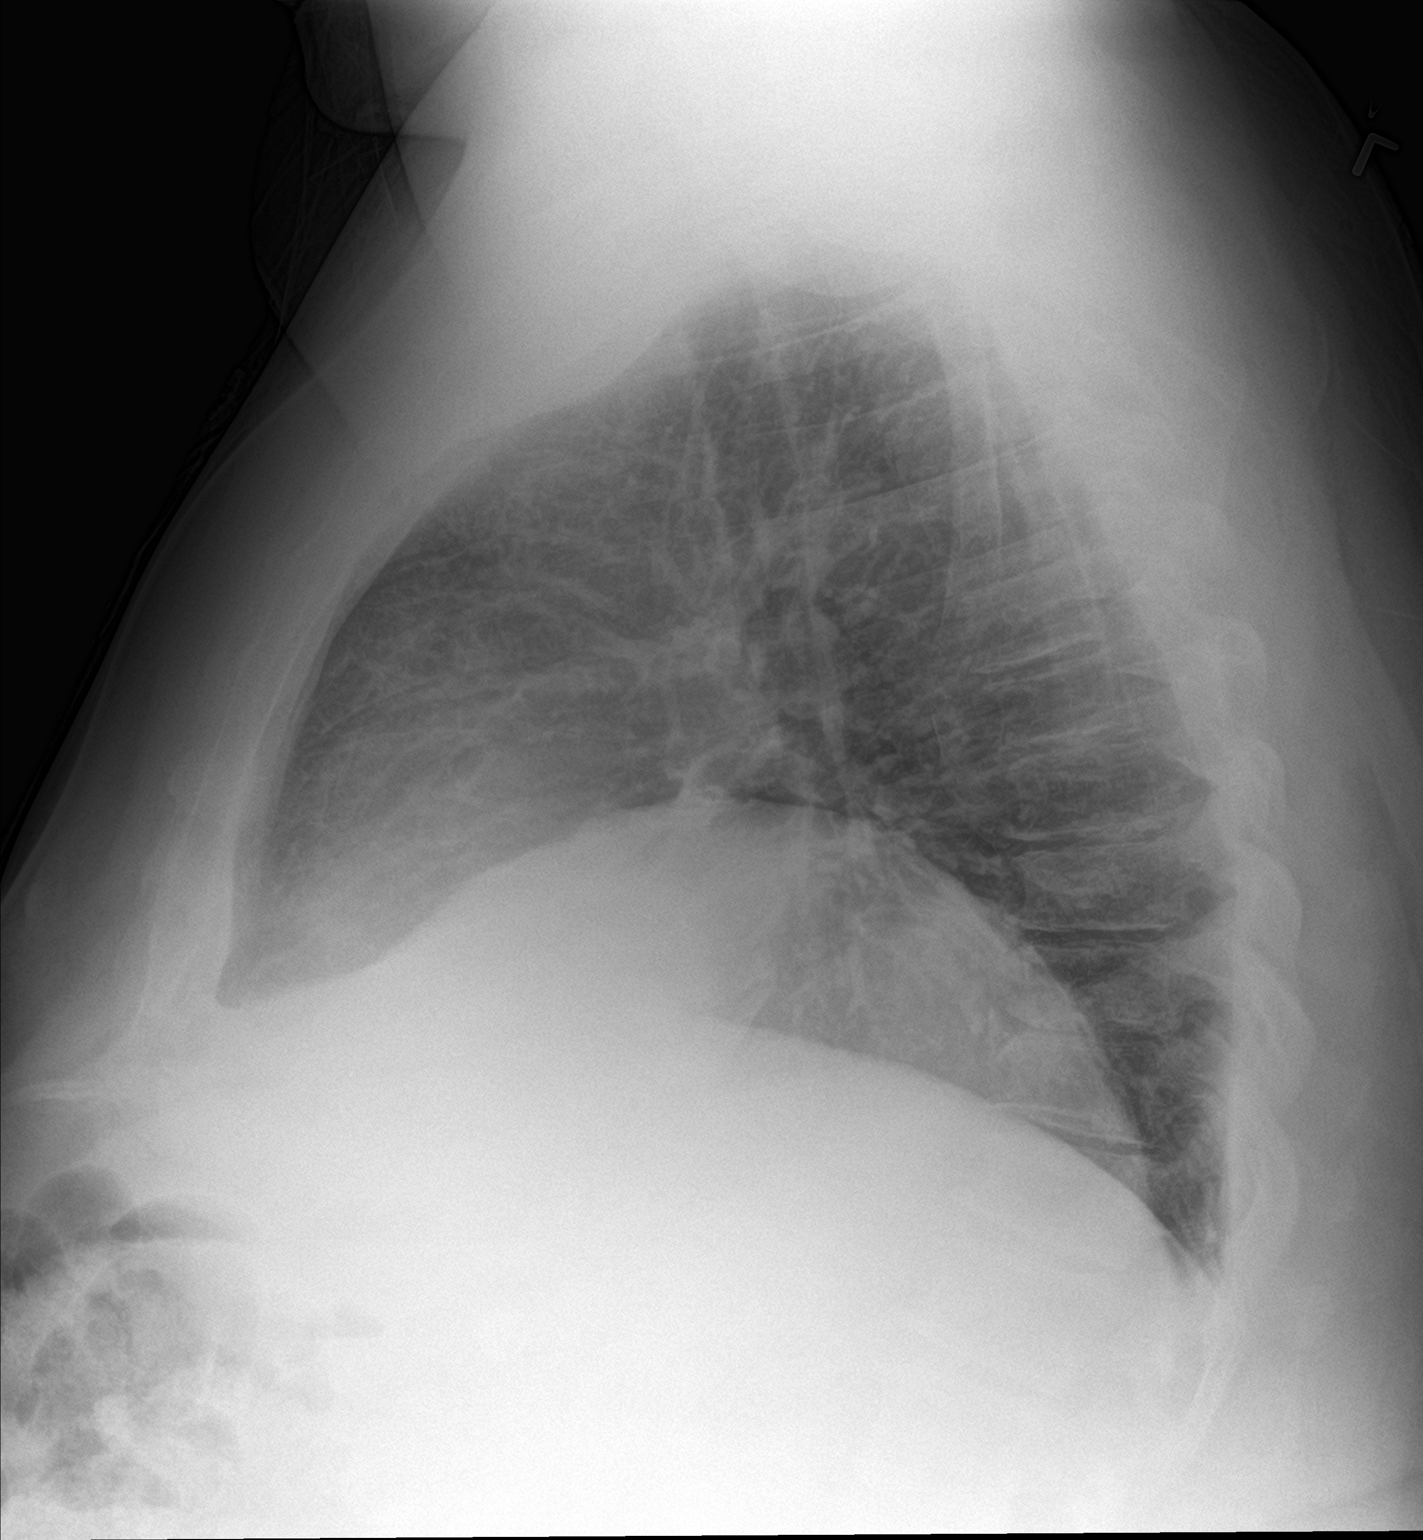

[2 of 2 positions shown; findings below may reference images not displayed]

FINDINGS: Heart is top-normal in size. There is aortic atherosclerosis.
Chronic elevation of the right hemidiaphragm versus eventration.
Subsegmental atelectasis and/or scarring at the left lung base
laterally. No acute osseous abnormality. Mild degenerative change of
the visualized lower cervical spine.
IMPRESSION: No active cardiopulmonary disease.

## 2017-12-04 ENCOUNTER — Ambulatory Visit: Payer: Medicare Other | Admitting: Nutrition

## 2017-12-30 ENCOUNTER — Encounter: Payer: Medicare Other | Attending: Family Medicine | Admitting: Nutrition

## 2017-12-30 DIAGNOSIS — R739 Hyperglycemia, unspecified: Secondary | ICD-10-CM

## 2017-12-30 NOTE — Patient Instructions (Signed)
Goals 1. Follow MY Plate 2. Eat meals on time 3. Measure foods out for portion control. 4. Cut out sweet tea and processed food.  5. Increase fresh fruits and vegetables. Lose 2-3 lbs per week.  Exercise 30 minutes 3-4 times per week.

## 2017-12-30 NOTE — Progress Notes (Signed)
  Medical Nutrition Therapy:  Appt start time: 0800 end time:  0900.  Assessment:  Primary concerns today: Obesity and Prediabetes with A1C 5.7% 02/26/17. PMH: HTN and GERD.  LIves with his daughter. (53 yrs old.). He does the cooking and shopping in the home.  Eats 2 meals per day.  Has been on Phentermine about 8 months ago with no weight loss. Has been overweight his whole life. He was always bigger than most of his friends growing up. Typically eats 2 meals a day of high fat high sugar foods. He struggles with eating regular meals and normal amounts of food. He had been through gastric bypass information sessions when he had [employer] when he was working. But was denied because of his insurance didn't cover bariatric surgery.. Carious teeth.  On Cpap for 20 yrs.    He did get down to 215 lbs when he was 52 years old.  He is not exercising. Current diet is excessive in high in processed foods, high in calories, fat and sodium and low in fresh fruits and vegetables.     He is engaged in willingness to make changes with diet and start walking some.   Preferred Learning Style:  Auditory  Visual  Hands on    Learning Readiness:   Ready  Change in progress   MEDICATIONS: see list   DIETARY INTAKE:   24-hr recall:  B ( 10  AM):  Sausage and eggs and biscuit/bread or oatmeal sweet tea 20 oz Snk ( AM):   L ( PM): bologna sandwich on white bread, mayo, cheese, chips, pickle and water  Snk ( PM):  D ( PM): Spaghetti 2 cups, sweet tea Snk ( PM): cookies or nabs,  Beverages: sweet tea, soda, water  Usual physical activity: ADL  Estimated energy needs: 1800  calories 200  g carbohydrates 135  g protein 50 g fat  Progress Towards Goal(s):  In progress.   Nutritional Diagnosis:  NB-1.1 Food and nutrition-related knowledge deficit As related to MOrbid obesity.  As evidenced by BMI > 40 and Diet recall.    Intervention:  Nutrition and  Pre-Diabetes education provided on My  Plate, CHO counting, meal planning, portion sizes, timing of meals, avoiding snacks between meals  signs/symptoms and treatment of hyper/hypoglycemia, taking medications as prescribed, benefits of exercising 30 minutes per day and prevention of DM.High Fiber Low Fat Low Salt Diet. Discussed to cut out processed foods from fast foods, sodas, tea, sweets, desserts and junk food.   Goals 1. Follow MY Plate 2. Eat meals on time 3. Measure foods out for portion control. 4. Cut out sweet tea and processed food.  5. Increase fresh fruits and vegetables. Lose 2-3 lbs per week.  Exercise 30 minutes 3-4 times per week.  Teaching Method Utilized:  Visual Auditory Hands on  Handouts given during visit include:  The Plate Method   Meal Plan Card    Barriers to learning/adherence to lifestyle change: none  Demonstrated degree of understanding via:  Teach Back   Monitoring/Evaluation:  Dietary intake, exercise, meal planning, and body weight in 1 month(s). Provided information about bariatric surgery with [facility]. He would have to inquire if they take Medicaid.  He may have to be [facility] to [facility] since he has Medicaid.   Would recommend to check A1c level every 6 months and lipid profile due to metabolic syndrome and high risk for Type 2 DM.

## 2018-01-05 ENCOUNTER — Encounter: Payer: Self-pay | Admitting: Nutrition

## 2018-01-07 ENCOUNTER — Encounter: Payer: Self-pay | Admitting: Nutrition

## 2018-01-28 ENCOUNTER — Ambulatory Visit: Payer: Medicare Other | Admitting: Nutrition

## 2018-01-29 ENCOUNTER — Encounter: Payer: Self-pay | Admitting: Nutrition

## 2018-01-29 ENCOUNTER — Encounter: Payer: Medicare Other | Attending: Family Medicine | Admitting: Nutrition

## 2018-01-29 DIAGNOSIS — R739 Hyperglycemia, unspecified: Secondary | ICD-10-CM

## 2018-01-29 NOTE — Patient Instructions (Addendum)
Goals 1. Keep up the great job! 2 Continue to reduce processed food 3. Lose 5 lbs in the next month. 4. Cut down on pickles and cheese and choose a lower fat cheese Increase higher fiber foods in diet

## 2018-01-29 NOTE — Progress Notes (Signed)
  Medical Nutrition Therapy:  Appt start time: 1130 end time: 1200   Assessment:  Primary concerns today: Obesity and Prediabetes with A1C 5.7% 02/26/17.   Has lost 15 lbs in the last month. Going to [facility] three days a week. Goes to see DR. Karie Kirks  in April?May. Changes: Cut back on bread, eating more vegetables- green beans and broccoli and salads. . Doesn't eat a lot of fruit much. Using pepper and garlic powder and garlic pepper.  Has cut out a lot of his processed foods but still eats some. Marland Kitchen  Has been working on getting rid of processed foods.       He is engaged in willingness to make changes with diet and start walking some.   Preferred Learning Style:  Auditory  Visual  Hands on    Learning Readiness:   Ready  Change in progress   MEDICATIONS: see list   DIETARY INTAKE:   24-hr recall:  B ( 10  AM):  2 eggs and sausage and 1 slice bread, water Snk ( AM):   L ( PM):    Snk ( PM):  D ( PM):  YHCWC,  [place] with [place] and spicy ranch,  Snk ( PM): cookies or nabs,  Beverages: sweet tea, soda, water  Usual physical activity: ADL  Estimated energy needs: 1800  calories 200  g carbohydrates 135  g protein 50 g fat  Progress Towards Goal(s):  In progress.   Nutritional Diagnosis:  NB-1.1 Food and nutrition-related knowledge deficit As related to MOrbid obesity.  As evidenced by BMI > 40 and Diet recall.    Intervention:  Nutrition and  Pre-Diabetes education provided on My Plate, CHO counting, meal planning, portion sizes, timing of meals, avoiding snacks between meals  signs/symptoms and treatment of hyper/hypoglycemia, taking medications as prescribed, benefits of exercising 30 minutes per day and prevention of DM.High Fiber Low Fat Low Salt Diet. Discussed to cut out processed foods from fast foods, sodas, tea, sweets, desserts and junk food.   Goals 1. Follow MY Plate 2. Eat meals on time 3. Measure foods out for portion control. 4. Cut out sweet tea  and processed food.  5. Increase fresh fruits and vegetables. Lose 2-3 lbs per week.  Exercise 30 minutes 3-4 times per week.  Teaching Method Utilized:  Visual Auditory Hands on  Handouts given during visit include:  The Plate Method   Meal Plan Card    Barriers to learning/adherence to lifestyle change: none  Demonstrated degree of understanding via:  Teach Back   Monitoring/Evaluation:  Dietary intake, exercise, meal planning, and body weight in 1 month(s).

## 2018-03-05 ENCOUNTER — Encounter: Payer: Medicare Other | Attending: Family Medicine | Admitting: Nutrition

## 2018-03-05 DIAGNOSIS — R739 Hyperglycemia, unspecified: Secondary | ICD-10-CM

## 2018-03-05 NOTE — Progress Notes (Signed)
  Medical Nutrition Therapy:  Appt start time: 1130 end time: 1200   Assessment:  Primary concerns today: Obesity and Prediabetes with A1C 5.7% 02/26/17.   No weight change since last visit. Has been dealing with cellulities. Has cut out sodas, working out at [employer] three times per week. Has cut out junk food and eating out less often.       He is engaged in willingness to make changes with diet and start walking some.   Preferred Learning Style:  Auditory  Visual  Hands on    Learning Readiness:   Ready  Change in progress   MEDICATIONS: see list   DIETARY INTAKE:   24-hr recall:  B ( 10  AM):  Cereal-Special K or HOney oats with 2% milk. Or eggs, sausage. Snk ( AM):   L ( PM):  Boiled chicken, fried squash,, mararoni salad 1.2c , 1/2 and 1/2 tea 32 oz Snk ( PM):  D ( PM):  Pork Chop baked, broccoli steamed and mac/cheese 1 cup Snk ( PM):  Beverages: water, soda and 1/2 and 1/2 tea. Usual physical activity: ADL  Estimated energy needs: 1800  calories 200  g carbohydrates 135  g protein 50 g fat  Progress Towards Goal(s):  In progress.   Nutritional Diagnosis:  NB-1.1 Food and nutrition-related knowledge deficit As related to MOrbid obesity.  As evidenced by BMI > 40 and Diet recall.    Intervention:  Nutrition and  Pre-Diabetes education provided on My Plate, CHO counting, meal planning, portion sizes, timing of meals, avoiding snacks between meals  signs/symptoms and treatment of hyper/hypoglycemia, taking medications as prescribed, benefits of exercising 30 minutes per day and prevention of DM.High Fiber Low Fat Low Salt Diet. Discussed to cut out processed foods from fast foods, sodas, tea, sweets, desserts and junk food.    Goals 1. Increase low carb vegetables and fresh fruits 2. Eat a higher fiber cereal 3. Keep working out at [employer] 3-4 times per week. Lose 1-2 lbs per week. Keep drinking  Cut out 1/2 and 1/2 tea and sodas.  Teaching Method Utilized:   Visual Auditory Hands on  Handouts given during visit include:  The Plate Method   Meal Plan Card    Barriers to learning/adherence to lifestyle change: none  Demonstrated degree of understanding via:  Teach Back   Monitoring/Evaluation:  Dietary intake, exercise, meal planning, and body weight in 3 month(s).

## 2018-03-05 NOTE — Patient Instructions (Addendum)
Goals 1. Increase low carb vegetables and fresh fruits 2. Eat a higher fiber cereal 3. Keep working out at [employer] 3-4 times per week. Lose 1-2 lbs per week. Keep drinking  Cut out 1/2 and 1/2 tea and sodas.

## 2018-03-09 ENCOUNTER — Encounter: Payer: Self-pay | Admitting: Nutrition

## 2018-05-07 ENCOUNTER — Encounter: Payer: Medicare Other | Attending: Family Medicine | Admitting: Nutrition

## 2018-05-07 ENCOUNTER — Encounter: Payer: Self-pay | Admitting: Nutrition

## 2018-05-07 DIAGNOSIS — R739 Hyperglycemia, unspecified: Secondary | ICD-10-CM

## 2018-05-07 NOTE — Progress Notes (Signed)
  Medical Nutrition Therapy:  Appt start time: 1130 end time: 1200   Assessment:  Primary concerns today: Obesity and Prediabetes with A1C 5.7% 02/26/17. Going to get labs for physical this week. Changes: Cut out sweets and processed foods. Cut back on sodas. Drinking more water. Has been more active with walking some and been going to [facility].       He is engaged in willingness to make changes with diet and start walking some.  Lost 5 lbs.  Slow and steady progress. Clothes fitting better. Feels better. Wants to lose down to 300 lbs   Preferred Learning Style:  Auditory  Visual  Hands on    Learning Readiness:   Ready  Change in progress   MEDICATIONS: see list   DIETARY INTAKE:   24-hr recall:  B ( 10  AM):  Cereal-Special K or HOney oats with 2% milk. Or eggs, sausage. Snk ( AM):   L ( PM):  Boiled chicken, fried squash,, mararoni salad 1.2c , 1/2 and 1/2 tea 32 oz Snk ( PM):  D ( PM):  Pork Chop baked, broccoli steamed and mac/cheese 1 cup Snk ( PM):  Beverages: water, soda and 1/2 and 1/2 tea. Usual physical activity: ADL  Estimated energy needs: 1800  calories 200  g carbohydrates 135  g protein 50 g fat  Progress Towards Goal(s):  In progress.   Nutritional Diagnosis:  NB-1.1 Food and nutrition-related knowledge deficit As related to MOrbid obesity.  As evidenced by BMI > 40 and Diet recall.    Intervention:  Nutrition and  Pre-Diabetes education provided on My Plate, CHO counting, meal planning, portion sizes, timing of meals, avoiding snacks between meals  signs/symptoms and treatment of hyper/hypoglycemia, taking medications as prescribed, benefits of exercising 30 minutes per day and prevention of DM.High Fiber Low Fat Low Salt Diet. Discussed to cut out processed foods from fast foods, sodas, tea, sweets, desserts and junk food.    Goals: 1. Lose 5-10 lbs in the next 6 months. 2. Increase fresh fruits and vegetables 3. Continue to cut out processed  foods 4. Walk more often and get back to [facility] three times per week.  Teaching Method Utilized:  Visual Auditory Hands on  Handouts given during visit include:  The Plate Method   Meal Plan Card    Barriers to learning/adherence to lifestyle change: none  Demonstrated degree of understanding via:  Teach Back   Monitoring/Evaluation:  Dietary intake, exercise, meal planning, and body weight in 3 month(s).

## 2018-05-07 NOTE — Patient Instructions (Addendum)
Goals: 1. Lose 5-10 lbs in the next 6 months. 2. Increase fresh fruits and vegetables 3. Continue to cut out processed foods 4. Walk more often and get back to [facility] three times per week.

## 2018-08-10 ENCOUNTER — Encounter: Payer: Medicare Other | Attending: Family Medicine | Admitting: Nutrition

## 2018-08-10 DIAGNOSIS — R739 Hyperglycemia, unspecified: Secondary | ICD-10-CM

## 2018-08-10 NOTE — Patient Instructions (Signed)
Goals 1. Go to bed by 1 am and get up at  8/9am.  2.Get back to eating breakfast 8/9 am. L)12-2 and dinner 5-7 pm. 3.Cut out all soda and drink only water. 4.Increase walking walk around the park 2 times and increase to 3-4 times as tolerated e-4 times per week. 5.Lose 10 lbs in 2 months.

## 2018-08-10 NOTE — Progress Notes (Signed)
  Medical Nutrition Therapy:  Appt start time: 1130 end time: 1200   Assessment:  Primary concerns today: Obesity and Prediabetes with A1C 6.2%, up from 5.7%.Marland Kitchen Lost 1 lb.Hasnt been eating 3 times per day, Has been sleeping longer and staying up at night.  Has cut out drinks a lot. Drinking more water. Hasn't increased exercise much. Making slow changes. Vitals with BMI 08/10/2018  Height _0   Weight 405 lbs  BMI 95.18  Systolic   Diastolic   Pulse   Respirations    Preferred Learning Style:  Auditory  Visual  Hands on    Learning Readiness:   Ready  Change in progress   MEDICATIONS: see list   DIETARY INTAKE:   24-hr recall:  B ( 10  AM):  Cereal-Special K or HOney oats with 2% milk. Or eggs, sausage. Snk ( AM):   L ( PM):  Boiled chicken, fried squash,, mararoni salad 1.2c , 1/2 and 1/2 tea 32 oz Snk ( PM):  D ( PM):  Pork Chop baked, broccoli steamed and mac/cheese 1 cup Snk ( PM):  Beverages: water, soda and 1/2 and 1/2 tea. Usual physical activity: ADL  Estimated energy needs: 1800  calories 200  g carbohydrates 135  g protein 50 g fat  Progress Towards Goal(s):  In progress.   Nutritional Diagnosis:  NB-1.1 Food and nutrition-related knowledge deficit As related to MOrbid obesity.  As evidenced by BMI > 40 and Diet recall.    Intervention:  Nutrition and  Pre-Diabetes education provided on My Plate, CHO counting, meal planning, portion sizes, timing of meals, avoiding snacks between meals  signs/symptoms and treatment of hyper/hypoglycemia, taking medications as prescribed, benefits of exercising 30 minutes per day and prevention of DM.High Fiber Low Fat Low Salt Diet. Discussed to cut out processed foods from fast foods, sodas, tea, sweets, desserts and junk food.    Goals 1. Go to bed by 1 am and get up at  8/9am.  2.Get back to eating breakfast 8/9 am. L)12-2 and dinner 5-7 pm. 3.Cut out all soda and drink only water. 4.Increase walking walk  around the park 2 times and increase to 3-4 times as tolerated e-4 times per week. 5.Lose 10 lbs in 2 months.  Teaching Method Utilized:  Visual Auditory Hands on  Handouts given during visit include:  The Plate Method   Meal Plan Card    Barriers to learning/adherence to lifestyle change: none  Demonstrated degree of understanding via:  Teach Back   Monitoring/Evaluation:  Dietary intake, exercise, meal planning, and body weight in 3 month(s).

## 2018-08-11 ENCOUNTER — Encounter: Payer: Self-pay | Admitting: Nutrition

## 2018-10-01 ENCOUNTER — Encounter: Payer: Medicare Other | Attending: Family Medicine | Admitting: Nutrition

## 2018-10-01 VITALS — Ht 72.0 in | Wt 398.0 lb

## 2018-10-01 DIAGNOSIS — R739 Hyperglycemia, unspecified: Secondary | ICD-10-CM

## 2018-10-01 NOTE — Progress Notes (Signed)
  Medical Nutrition Therapy:  Appt start time: 1130 end time: 1200   Assessment:  Primary concerns today: Obesity and Prediabetes with A1C 6.2%, up from 5.7%.Marland Kitchen Lost 7 lb.   Has been gong to the [facility]  A few times. Eating better consistent meals.  Still working on getting more sleep.Marland Kitchen  Has cut out drinks a lot. Drinking more water. [facility] Making slow changes. Vitals with BMI 08/10/2018  Height 6\' 0"   Weight 405 lbs  BMI 97.58  Systolic   Diastolic   Pulse   Respirations    CMP Latest Ref Rng & Units 09/16/2016 07/14/2016 07/11/2016  Glucose 65 - 99 mg/dL 153(H) 100(H) 97  BUN 6 - 20 mg/dL 21(H) 16 20  Creatinine 0.61 - 1.24 mg/dL 1.55(H) 2.07(H) 1.60(H)  Sodium 135 - 145 mmol/L 136 134(L) 133(L)  Potassium 3.5 - 5.1 mmol/L 4.4 4.6 4.4  Chloride 101 - 111 mmol/L 104 100(L) 103  CO2 22 - 32 mmol/L 27 27 28   Calcium 8.9 - 10.3 mg/dL 8.3(L) 8.8(L) 7.9(L)  Total Protein 6.5 - 8.1 g/dL - 7.0 6.5  Total Bilirubin 0.3 - 1.2 mg/dL - 1.1 0.7  Alkaline Phos 38 - 126 U/L - 101 97  AST 15 - 41 U/L - 37 20  ALT 17 - 63 U/L - 28 18    Preferred Learning Style:  Auditory  Visual  Hands on    Learning Readiness:   Ready  Change in progress   MEDICATIONS: see list   DIETARY INTAKE:   24-hr recall:  B ( 10  AM):  Egg, sausage and toast, sweet ea or water Snk ( AM):   L ( PM):  6 in sub spicy italian, Sweet tea. Snk ( PM):  D ( PM):  Toss salad, ham with [place] Dressing. water  Snk ( PM):  Beverages: water, soda and 1/2 and 1/2 tea. Usual physical activity: ADL  Estimated energy needs: 1800  calories 200  g carbohydrates 135  g protein 50 g fat  Progress Towards Goal(s):  In progress.   Nutritional Diagnosis:  NB-1.1 Food and nutrition-related knowledge deficit As related to MOrbid obesity.  As evidenced by BMI > 40 and Diet recall.    Intervention:  Nutrition and  Pre-Diabetes education provided on My Plate, CHO counting, meal  planning, portion sizes, timing of meals, avoiding snacks between meals  signs/symptoms and treatment of hyper/hypoglycemia, taking medications as prescribed, benefits of exercising 30 minutes per day and prevention of DM.High Fiber Low Fat Low Salt Diet. Discussed to cut out processed foods from fast foods, sodas, tea, sweets, desserts and junk food.   Goals 1.  Increase exercise three times per week at [facility] 2. Lose 5   Lbs a month 3. Increase lower carb vegetables. Increase eating more lower carb vegetabls of broccoli and green beans or  T Greens, Drink more water to gallon a day. Get A1C down below 6%.  Teaching Method Utilized:  Visual Auditory Hands on  Handouts given during visit include:  The Plate Method   Meal Plan Card    Barriers to learning/adherence to lifestyle change: none  Demonstrated degree of understanding via:  Teach Back   Monitoring/Evaluation:  Dietary intake, exercise, meal planning, and body weight in 3 month(s).

## 2018-10-01 NOTE — Patient Instructions (Addendum)
Goals 1.  Increase exercise three times per week at [facility] 2. Lose 5   Lbs a month 3. Increase lower carb vegetables. Increase eating more lower carb vegetabls of broccoli and green beans or  T Greens, Drink more water to gallon a day. Get A1C down below 6%.

## 2018-10-14 ENCOUNTER — Encounter: Payer: Self-pay | Admitting: Nutrition

## 2019-01-07 ENCOUNTER — Ambulatory Visit: Payer: Medicare Other | Admitting: Nutrition

## 2019-01-18 ENCOUNTER — Encounter: Payer: Medicare Other | Attending: Family Medicine | Admitting: Nutrition

## 2019-01-18 VITALS — Ht 71.0 in | Wt 396.0 lb

## 2019-01-18 DIAGNOSIS — N182 Chronic kidney disease, stage 2 (mild): Secondary | ICD-10-CM

## 2019-01-18 DIAGNOSIS — R739 Hyperglycemia, unspecified: Secondary | ICD-10-CM | POA: Insufficient documentation

## 2019-01-18 NOTE — Patient Instructions (Signed)
Goals .Lose 10 lbs.  Don't skip meals Increase fresh fruits and vegetables 2 servings at least per day Get  Back to [facility] twice a week for 60 minutes at a time Talk to MD about considering Metformin 500 mg BID.

## 2019-01-18 NOTE — Progress Notes (Signed)
Medical Nutrition Therapy:  Appt start time:0830 end time: 900 Assessment:  Primary concerns today: Obesity and Prediabetes with A1C 6.2%, up from 5.7%.Marland Kitchen  [facility] would like [facility]. Had to start caring for his mom who broke her leg.  Lost 2 lbs.  Diet still excessive in fat, salt and processed foods. Doesn't like a lot of fruit. Willing to eat more vegetables. Wants to lose weight. Creatine level has improved since stopping one of his medications. Calcium is low. Scheduled for f/u this month with PCP. Would benefit from starting Metformin 500 gm BID due to metabolic syndrome and cardio vascular risks from morbid obesity and impaired fasting glucose.   Vitals with BMI 08/10/2018  Height 6\' 0"   Weight 405 lbs  BMI 11.94  Systolic   Diastolic   Pulse   Respirations    CMP Latest Ref Rng & Units 09/16/2016 07/14/2016 07/11/2016  Glucose 65 - 99 mg/dL 153(H) 100(H) 97  BUN 6 - 20 mg/dL 21(H) 16 20  Creatinine 0.61 - 1.24 mg/dL 1.55(H) 2.07(H) 1.60(H)  Sodium 135 - 145 mmol/L 136 134(L) 133(L)  Potassium 3.5 - 5.1 mmol/L 4.4 4.6 4.4  Chloride 101 - 111 mmol/L 104 100(L) 103  CO2 22 - 32 mmol/L 27 27 28   Calcium 8.9 - 10.3 mg/dL 8.3(L) 8.8(L) 7.9(L)  Total Protein 6.5 - 8.1 g/dL - 7.0 6.5  Total Bilirubin 0.3 - 1.2 mg/dL - 1.1 0.7  Alkaline Phos 38 - 126 U/L - 101 97  AST 15 - 41 U/L - 37 20  ALT 17 - 63 U/L - 28 18     Preferred Learning Style:  Auditory  Visual  Hands on    Learning Readiness:   Ready  Change in progress   MEDICATIONS: see list   DIETARY INTAKE:   24-hr recall:  B ( 10  AM):   2 Eggs, sausage, 1 slice bread wheat,  Sweet tea-reduced sugar  Snk ( AM):   L ( PM): Hot dog and tator tots, water nk ( PM):  D ( PM):  skipped Snk ( PM):  Beverages: water, soda 1/2 and 1/2 tea. Usual physical activity: ADL  Estimated energy needs: 1800  calories 200  g carbohydrates 135  g protein 50 g fat  Progress Towards  Goal(s):  In progress.   Nutritional Diagnosis:  NB-1.1 Food and nutrition-related knowledge deficit As related to MOrbid obesity.  As evidenced by BMI > 40 and Diet recall.    Intervention:  Nutrition and  Pre-Diabetes education provided on My Plate, CHO counting, meal planning, portion sizes, timing of meals, avoiding snacks between meals  signs/symptoms and treatment of hyper/hypoglycemia, taking medications as prescribed, benefits of exercising 30 minutes per day and prevention of DM.High Fiber Low Fat Low Salt Diet. Discussed to cut out processed foods from fast foods, sodas, tea, sweets, desserts and junk food.    Goals .Lose 10 lbs.  Cut out tea and sodas Don't skip meals Increase fresh fruits and vegetables 2 servings at least per day Get  Back to [facility] twice a week for 60 minutes at a time Talk to MD about considering Metformin 500 mg BID.  Teaching Method Utilized:  Visual Auditory Hands on  Handouts given during visit include:  The Plate Method   Meal Plan Card    Barriers to learning/adherence to lifestyle change: none  Demonstrated degree of understanding via:  Teach Back   Monitoring/Evaluation:  Dietary intake,  exercise, meal planning, and body weight in 3 month(s).     Recommend to start Metformin 500 mg BID due to impaired fasting glucose. Needs an A1C done to evaluate glucose control.

## 2019-01-19 ENCOUNTER — Ambulatory Visit: Payer: Medicare Other | Admitting: Nutrition

## 2019-02-16 ENCOUNTER — Other Ambulatory Visit: Payer: Self-pay

## 2019-04-27 ENCOUNTER — Encounter: Payer: Medicare Other | Attending: Family Medicine | Admitting: Nutrition

## 2019-04-27 ENCOUNTER — Telehealth: Payer: Self-pay | Admitting: Nutrition

## 2019-04-27 ENCOUNTER — Other Ambulatory Visit: Payer: Self-pay

## 2019-04-27 ENCOUNTER — Encounter: Payer: Self-pay | Admitting: Nutrition

## 2019-04-27 DIAGNOSIS — R739 Hyperglycemia, unspecified: Secondary | ICD-10-CM | POA: Insufficient documentation

## 2019-04-27 NOTE — Telephone Encounter (Signed)
No answer

## 2019-04-27 NOTE — Patient Instructions (Addendum)
   Goals .Lose 10 lbs.  Cut out tea and sodas Don't skip meals Increase fresh fruits and vegetables 2 servings at least per day Increase walking 30 minutes daily.

## 2019-04-27 NOTE — Telephone Encounter (Signed)
VM lett to call to do phone follow up visit. No answer.

## 2019-04-27 NOTE — Progress Notes (Signed)
  Medical Nutrition Therapy:  Appt start time: end1330 time: 3532 Assessment:  Primary concerns today: Obesity and Prediabetes with A1C 6.2%, [facility]. Has been trying to  Walk some for 7 minutes a day Has been working on eating more vegetables. Has ben drinking more water and cut down on sodas. Doesn't know if he lost any weight. Saw Dr. Karie Kirks a few weeks ago at curbside. Limited engagement.   Vitals with BMI 08/10/2018  Height 6\' 0"   Weight 405 lbs  BMI 99.24  Systolic   Diastolic   Pulse   Respirations    CMP Latest Ref Rng & Units 09/16/2016 07/14/2016 07/11/2016  Glucose 65 - 99 mg/dL 153(H) 100(H) 97  BUN 6 - 20 mg/dL 21(H) 16 20  Creatinine 0.61 - 1.24 mg/dL 1.55(H) 2.07(H) 1.60(H)  Sodium 135 - 145 mmol/L 136 134(L) 133(L)  Potassium 3.5 - 5.1 mmol/L 4.4 4.6 4.4  Chloride 101 - 111 mmol/L 104 100(L) 103  CO2 22 - 32 mmol/L 27 27 28   Calcium 8.9 - 10.3 mg/dL 8.3(L) 8.8(L) 7.9(L)  Total Protein 6.5 - 8.1 g/dL - 7.0 6.5  Total Bilirubin 0.3 - 1.2 mg/dL - 1.1 0.7  Alkaline Phos 38 - 126 U/L - 101 97  AST 15 - 41 U/L - 37 20  ALT 17 - 63 U/L - 28 18     Preferred Learning Style:  Auditory  Visual  Hands on    Learning Readiness:   Ready  Change in progress   MEDICATIONS: see list   DIETARY INTAKE:   24-hr recall:  B ( 10  AM):   2 Eggs, sausage, 1 slice bread wheat,  Sweet tea-reduced sugar  Snk ( AM):   L ( PM): Hot dog and tator tots, water nk ( PM):  D ( PM):  Skipped or misc foods. Snk ( PM):  Beverages: water,  Usual physical activity: ADL  Estimated energy needs: 1800  calories 200  g carbohydrates 135  g protein 50 g fat  Progress Towards Goal(s):  In progress.   Nutritional Diagnosis:  NB-1.1 Food and nutrition-related knowledge deficit As related to MOrbid obesity.  As evidenced by BMI > 40 and Diet recall.    Intervention:  Nutrition and  Pre-Diabetes education provided on My Plate, CHO  counting, meal planning, portion sizes, timing of meals, avoiding snacks between meals  signs/symptoms and treatment of hyper/hypoglycemia, taking medications as prescribed, benefits of exercising 30 minutes per day and prevention of DM.High Fiber Low Fat Low Salt Diet. Discussed to cut out processed foods from fast foods, sodas, tea, sweets, desserts and junk food.    Goals .Lose 10 lbs.  Cut out tea and sodas Don't skip meals Increase fresh fruits and vegetables 2 servings at least per day Increase walking 30 minutes daily.  Teaching Method Utilized:  Visual Auditory Hands on  Handouts given during visit include:  The Plate Method   Meal Plan Card    Barriers to learning/adherence to lifestyle change: none  Demonstrated degree of understanding via:  Teach Back   Monitoring/Evaluation:  Dietary intake, exercise, meal planning, and body weight in 3 month(s).     Recommend to start Metformin 500 mg BID due to impaired fasting glucose. Needs an A1C done to evaluate glucose control.

## 2019-08-24 ENCOUNTER — Ambulatory Visit: Payer: Medicare Other | Admitting: Nutrition

## 2019-09-07 ENCOUNTER — Ambulatory Visit: Payer: Medicare Other | Admitting: Nutrition

## 2019-09-14 ENCOUNTER — Encounter: Payer: Medicare Other | Attending: Family Medicine | Admitting: Nutrition

## 2019-09-14 ENCOUNTER — Other Ambulatory Visit: Payer: Self-pay

## 2019-09-14 ENCOUNTER — Encounter: Payer: Self-pay | Admitting: Nutrition

## 2019-09-14 DIAGNOSIS — Z713 Dietary counseling and surveillance: Secondary | ICD-10-CM | POA: Diagnosis present

## 2019-09-14 DIAGNOSIS — Z6841 Body Mass Index (BMI) 40.0 and over, adult: Secondary | ICD-10-CM | POA: Diagnosis not present

## 2019-09-14 DIAGNOSIS — R7303 Prediabetes: Secondary | ICD-10-CM | POA: Insufficient documentation

## 2019-09-14 NOTE — Progress Notes (Signed)
Medical Nutrition Therapy:  Appt start time:  1150  time: 1210 Assessment:  Primary concerns today: Obesity and Prediabetes with A1C 6.2%, Gained 25 lbs.  Both lower legs appear swollen. He notes being at home with COVID has made things worse. He cant get his legs wrapped like he needs to. Transportation will only take him to appts twice a week. He has been trying to wrap his leg hiimself. Still poor food choices due to access and availability of foods. Healing scars on left leg. Right leg wrapped a little with ace bandage.  Diet remains high in fat, sodium and insuffient in fruits, vegetables and heart healthy foods. He denies using salt on foods but does eat a lot of processed foods.   Vitals with BMI 08/10/2018  Height 6\' 0"   Weight 405 lbs  BMI [id number]  Systolic   Diastolic   Pulse   Respirations    CMP Latest Ref Rng & Units 09/16/2016 07/14/2016 07/11/2016  Glucose 65 - 99 mg/dL 153(H) 100(H) 97  BUN 6 - 20 mg/dL 21(H) 16 20  Creatinine 0.61 - 1.24 mg/dL 1.55(H) 2.07(H) 1.60(H)  Sodium 135 - 145 mmol/L 136 134(L) 133(L)  Potassium 3.5 - 5.1 mmol/L 4.4 4.6 4.4  Chloride 101 - 111 mmol/L 104 100(L) 103  CO2 22 - 32 mmol/L 27 27 28   Calcium 8.9 - 10.3 mg/dL 8.3(L) 8.8(L) 7.9(L)  Total Protein 6.5 - 8.1 g/dL - 7.0 6.5  Total Bilirubin 0.3 - 1.2 mg/dL - 1.1 0.7  Alkaline Phos 38 - 126 U/L - 101 97  AST 15 - 41 U/L - 37 20  ALT 17 - 63 U/L - 28 18     Preferred Learning Style:  Auditory  Visual  Hands on    Learning Readiness:   Ready  Change in progress   MEDICATIONS: see list   DIETARY INTAKE:   24-hr recall:  B ( 10  AM):   2 Eggs, sausage, 1 slice bread wheat,  Sweet tea-reduced sugar  Snk ( AM):   L ( PM): Hot dog and tator tots, water nk ( PM):  D ( PM):  Skipped or misc foods. Snk ( PM):  Beverages: water,  Usual physical activity: ADL  Estimated energy needs: 1800  calories 200  g carbohydrates 135  g protein 50 g fat  Progress Towards  Goal(s):  In progress.   Nutritional Diagnosis:  NB-1.1 Food and nutrition-related knowledge deficit As related to MOrbid obesity.  As evidenced by BMI > 40 and Diet recall.    Intervention:  Nutrition and  Pre-Diabetes education provided on My Plate, CHO counting, meal planning, portion sizes, timing of meals, avoiding snacks between meals  signs/symptoms and treatment of hyper/hypoglycemia, taking medications as prescribed, benefits of exercising 30 minutes per day and prevention of DM.High Fiber Low Fat Low Salt Diet. Discussed to cut out processed foods from fast foods, sodas, tea, sweets, desserts and junk food.    Goals .Lose 10 lbs.  Cut out processed foods of hot dogs and packaged meats. Cut out tea and sodas Don't skip meals Increase fresh fruits and vegetables 2 servings at least per day Increase walking 30 minutes daily.  Teaching Method Utilized:  Visual Auditory Hands on  Handouts given during visit include:  The Plate Method   Meal Plan Card    Barriers to learning/adherence to lifestyle change: none  Demonstrated degree of understanding via:  Teach Back   Monitoring/Evaluation:  Dietary intake, exercise, meal planning, and body  weight in 3 month(s).     Would benefit from lymphadema treatment of his legs.

## 2019-09-14 NOTE — Patient Instructions (Addendum)
GOals   [place] Keep drinking  Only water Watch portion sizes Avoid snacks Eat meals on time Use Mrs Deliah Boston and other herbs and spices. [place] 5 lbs per month Increase lower carb vegetables

## 2019-09-20 ENCOUNTER — Encounter: Payer: Self-pay | Admitting: Nutrition

## 2019-10-29 ENCOUNTER — Ambulatory Visit (HOSPITAL_COMMUNITY): Admission: RE | Admit: 2019-10-29 | Payer: Medicare Other | Source: Home / Self Care | Admitting: Oral Surgery

## 2019-10-29 ENCOUNTER — Encounter (HOSPITAL_COMMUNITY): Admission: RE | Payer: Self-pay | Source: Home / Self Care

## 2019-10-29 SURGERY — MULTIPLE EXTRACTION WITH ALVEOLOPLASTY
Anesthesia: General

## 2019-11-23 ENCOUNTER — Encounter: Payer: Medicare Other | Attending: Family Medicine | Admitting: Nutrition

## 2019-11-23 ENCOUNTER — Encounter: Payer: Self-pay | Admitting: Nutrition

## 2019-11-23 ENCOUNTER — Other Ambulatory Visit: Payer: Self-pay

## 2019-11-23 VITALS — Ht 72.0 in | Wt >= 6400 oz

## 2019-11-23 DIAGNOSIS — Z6841 Body Mass Index (BMI) 40.0 and over, adult: Secondary | ICD-10-CM | POA: Diagnosis not present

## 2019-11-23 DIAGNOSIS — Z713 Dietary counseling and surveillance: Secondary | ICD-10-CM | POA: Insufficient documentation

## 2019-11-23 DIAGNOSIS — R739 Hyperglycemia, unspecified: Secondary | ICD-10-CM

## 2019-11-23 NOTE — Progress Notes (Signed)
  Medical Nutrition Therapy:  Appt start time:  1130  Time: 1200  Had been walking until [place] and he got sick.   Assessment:  Primary concerns today: Obesity and Prediabetes with A1C 6.2%. He does sthe cooking.  Daughter lives with him(55 yrs old).  Has been drinkinga lot more water and eating better balanced foods. Lost 7 lbs. Has cut down on sodas and processed food but not cut out completely. Working on increasing walking again. Will go get blood work from Dr. Burnard Hawthorne orders one day this week.   Vitals with BMI 08/10/2018  Height 6\' 0"   Weight 405 lbs  BMI [id number]  Systolic   Diastolic   Pulse   Respirations    CMP Latest Ref Rng & Units 09/16/2016 07/14/2016 07/11/2016  Glucose 65 - 99 mg/dL 153(H) 100(H) 97  BUN 6 - 20 mg/dL 21(H) 16 20  Creatinine 0.61 - 1.24 mg/dL 1.55(H) 2.07(H) 1.60(H)  Sodium 135 - 145 mmol/L 136 134(L) 133(L)  Potassium 3.5 - 5.1 mmol/L 4.4 4.6 4.4  Chloride 101 - 111 mmol/L 104 100(L) 103  CO2 22 - 32 mmol/L 27 27 28   Calcium 8.9 - 10.3 mg/dL 8.3(L) 8.8(L) 7.9(L)  Total Protein 6.5 - 8.1 g/dL - 7.0 6.5  Total Bilirubin 0.3 - 1.2 mg/dL - 1.1 0.7  Alkaline Phos 38 - 126 U/L - 101 97  AST 15 - 41 U/L - 37 20  ALT 17 - 63 U/L - 28 18     Preferred Learning Style:  Auditory  Visual  Hands on    Learning Readiness:   Ready  Change in progress   MEDICATIONS: see list   DIETARY INTAKE:   24-hr recall:  B ( 10  AM):  Eggs, oatmeal,   Snk ( AM):   L ( PM): Meatloaf and green beans, 1 slice water nk ( PM):  D ( PM):  Spaghetti, water Snk ( PM):  Beverages: water,  Usual physical activity: ADL  Estimated energy needs: 1800  calories 200  g carbohydrates 135  g protein 50 g fat  Progress Towards Goal(s):  In progress.   Nutritional Diagnosis:  NB-1.1 Food and nutrition-related knowledge deficit As related to MOrbid obesity.  As evidenced by BMI > 40 and Diet recall.    Intervention:  Nutrition and  Pre-Diabetes  education provided on My Plate, CHO counting, meal planning, portion sizes, timing of meals, avoiding snacks between meals  signs/symptoms and treatment of hyper/hypoglycemia, taking medications as prescribed, benefits of exercising 30 minutes per day and prevention of DM.High Fiber Low Fat Low Salt Diet. Discussed to cut out processed foods from fast foods, sodas, tea, sweets, desserts and junk food.    Goals Get blood work done [employer] walking every other day for 30 minutes. .Lose 10 lbs.  Cut out processed foods of hot dogs and packaged meats. Cut out tea and sodas Don't skip meals Increase fresh fruits and vegetables 2 servings at least per day   Teaching Method Utilized:  Visual Auditory Hands on  Handouts given during visit include:  The Plate Method   Meal Plan Card    Barriers to learning/adherence to lifestyle change: none  Demonstrated degree of understanding via:  Teach Back   Monitoring/Evaluation:  Dietary intake, exercise, meal planning, and body weight in 3 month(s).     Would benefit from lymphadema treatment of his legs.

## 2019-11-23 NOTE — Patient Instructions (Addendum)
Goals Get blood work done [employer] walking every other day for 30 minutes. .Lose 10 lbs. In three months; Cut out processed foods of hot dogs and packaged meats. Cut out tea and sodas Don't skip meals Increase fresh fruits and vegetables 2 servings at least per day Keep drinking a gallon of water per day.

## 2020-02-24 ENCOUNTER — Other Ambulatory Visit: Payer: Self-pay

## 2020-02-24 ENCOUNTER — Encounter: Payer: Medicare Other | Attending: Family Medicine | Admitting: Nutrition

## 2020-02-24 ENCOUNTER — Encounter: Payer: Self-pay | Admitting: Nutrition

## 2020-02-24 DIAGNOSIS — E66813 Obesity, class 3: Secondary | ICD-10-CM

## 2020-02-24 DIAGNOSIS — N182 Chronic kidney disease, stage 2 (mild): Secondary | ICD-10-CM

## 2020-02-24 DIAGNOSIS — R739 Hyperglycemia, unspecified: Secondary | ICD-10-CM

## 2020-02-24 NOTE — Patient Instructions (Addendum)
Goals  Lose 2-3 per week. Increase walking 4-5 times per week Increaser lower carb vegetables with lunch and dinner Avoid processed meats, cheese and chips, crackers, nabs Drink only water. No sweet tea.

## 2020-02-24 NOTE — Progress Notes (Signed)
  Medical Nutrition Therapy:  Appt start time:  1130  Time: 1200   Assessment:  Primary concerns today: Obesity, HTN  and Prediabetes with A1C 6.3%. CKD Creatine 1.55 mg/dl. Trying to get back into walking a mile three times per week. 14 lbs weight gain in the last 3 months. Reports MD's working on his medications for fluid adjustment. Working on eating better foods and getting away from processed foods. Admits due to the colder weather, he hasn't been out as much.  Vitals with BMI 08/10/2018  Height 6\' 0"   Weight 405 lbs  BMI [id number]  Systolic   Diastolic   Pulse   Respirations    CMP Latest Ref Rng & Units 09/16/2016 07/14/2016 07/11/2016  Glucose 65 - 99 mg/dL 153(H) 100(H) 97  BUN 6 - 20 mg/dL 21(H) 16 20  Creatinine 0.61 - 1.24 mg/dL 1.55(H) 2.07(H) 1.60(H)  Sodium 135 - 145 mmol/L 136 134(L) 133(L)  Potassium 3.5 - 5.1 mmol/L 4.4 4.6 4.4  Chloride 101 - 111 mmol/L 104 100(L) 103  CO2 22 - 32 mmol/L 27 27 28   Calcium 8.9 - 10.3 mg/dL 8.3(L) 8.8(L) 7.9(L)  Total Protein 6.5 - 8.1 g/dL - 7.0 6.5  Total Bilirubin 0.3 - 1.2 mg/dL - 1.1 0.7  Alkaline Phos 38 - 126 U/L - 101 97  AST 15 - 41 U/L - 37 20  ALT 17 - 63 U/L - 28 18     Preferred Learning Style:  Auditory  Visual  Hands on    Learning Readiness:   Ready  Change in progress   MEDICATIONS: see list   DIETARY INTAKE:   24-hr recall:  B ( 10  AM): Honey bunches of oats,   Snk ( AM):   L ( PM): ham and cheese on white bread, water  ( PM):  D ( PM):  Meatloaf, green beans, Water and baked potato, Snk ( PM):  Beverages: water,  Usual physical activity: ADL  Estimated energy needs: 1800  calories 200  g carbohydrates 135  g protein 50 g fat  Progress Towards Goal(s):  In progress.   Nutritional Diagnosis:  NB-1.1 Food and nutrition-related knowledge deficit As related to MOrbid obesity.  As evidenced by BMI > 40 and Diet recall.    Intervention:  Nutrition and  Pre-Diabetes education provided  on My Plate, CHO counting, meal planning, portion sizes, timing of meals, avoiding snacks between meals  signs/symptoms and treatment of hyper/hypoglycemia, taking medications as prescribed, benefits of exercising 30 minutes per day and prevention of DM.High Fiber Low Fat Low Salt Diet. Discussed to cut out processed foods from fast foods, sodas, tea, sweets, desserts and junk food.    Goals  Lose 2-3 per week. Increase walking 4-5 times per week Increaser lower carb vegetables with lunch and dinner Avoid processed meats, cheese and chips, crackers, nabs Drink only water. No sweet tea.  Lose 2-3 per week. Increase walking 4-5 times per week Increaser lower carb vegetables with lunch and dinner Avoid processed meats, cheese and chips, crackers, nabs Drink only water. No sweet tea   Teaching Method Utilized:  Visual Auditory Hands on  Handouts given during visit include:  The Plate Method   Meal Plan Card    Barriers to learning/adherence to lifestyle change: none  Demonstrated degree of understanding via:  Teach Back   Monitoring/Evaluation:  Dietary intake, exercise, meal planning, and body weight in 3 month(s).

## 2020-03-08 ENCOUNTER — Encounter: Payer: Self-pay | Admitting: Nutrition

## 2020-06-07 ENCOUNTER — Encounter: Payer: Medicare Other | Attending: Family Medicine | Admitting: Nutrition

## 2020-06-07 ENCOUNTER — Other Ambulatory Visit: Payer: Self-pay

## 2020-06-07 ENCOUNTER — Encounter: Payer: Self-pay | Admitting: Nutrition

## 2020-06-07 DIAGNOSIS — R739 Hyperglycemia, unspecified: Secondary | ICD-10-CM | POA: Insufficient documentation

## 2020-06-07 NOTE — Progress Notes (Signed)
  Medical Nutrition Therapy:  Appt start time:  1130  Time: 1200   Assessment:  Primary concerns today: Obesity, HTN  and Prediabetes with A1C 6.3%. CKD Creatine 1.55 mg/dl.  No weight loss. Gained 1 lb. Has been trying to do more walking a few times per week. Still struggles with sleeping in and not eating meals on time. Has been working on eating more broccoli and some vegetables and less processed foods..  Trying to drinking more water. Still struggles with drinking sodas. Interested in looking into bariatric surgery again. Wants to be reevaluated for his CPAP machine as it's been 10+ years since it's been adjusted. Wants to see an endocrinologist to see about his thryoid and his weight.   CMP Latest Ref Rng & Units 09/16/2016 07/14/2016 07/11/2016  Glucose 65 - 99 mg/dL 153(H) 100(H) 97  BUN 6 - 20 mg/dL 21(H) 16 20  Creatinine 0.61 - 1.24 mg/dL 1.55(H) 2.07(H) 1.60(H)  Sodium 135 - 145 mmol/L 136 134(L) 133(L)  Potassium 3.5 - 5.1 mmol/L 4.4 4.6 4.4  Chloride 101 - 111 mmol/L 104 100(L) 103  CO2 22 - 32 mmol/L 27 27 28   Calcium 8.9 - 10.3 mg/dL 8.3(L) 8.8(L) 7.9(L)  Total Protein 6.5 - 8.1 g/dL - 7.0 6.5  Total Bilirubin 0.3 - 1.2 mg/dL - 1.1 0.7  Alkaline Phos 38 - 126 U/L - 101 97  AST 15 - 41 U/L - 37 20  ALT 17 - 63 U/L - 28 18     Preferred Learning Style:  Auditory  Visual  Hands on    Learning Readiness:   Ready  Change in progress  MEDICATIONS: see list   DIETARY INTAKE:  24-hr recall:  B ( 10  AM): Oatmeal or eggs and grits,  L ( PM): Broiled shrimp, baked poato and salad, sweet tea  ( PM):  D ( PM):  Leftovers from lunch , 1/2  1/2 tea Snk ( PM):  Beverages: water,  Usual physical activity: ADL  Estimated energy needs: 1800  calories 200  g carbohydrates 135  g protein 50 g fat  Progress Towards Goal(s):  In progress.   Nutritional Diagnosis:  NB-1.1 Food and nutrition-related knowledge deficit As related to MOrbid obesity.  As evidenced by  BMI > 40 and Diet recall.    Intervention:  Nutrition and  Pre-Diabetes education provided on My Plate, CHO counting, meal planning, portion sizes, timing of meals, avoiding snacks between meals  signs/symptoms and treatment of hyper/hypoglycemia, taking medications as prescribed, benefits of exercising 30 minutes per day and prevention of DM.High Fiber Low Fat Low Salt Diet. Discussed to cut out processed foods from fast foods, sodas, tea, sweets, desserts and junk food.    Goals  To get labs at [facility] this week if possible.  Re sign up for [employer]  Work out at [employer] 3 times per week. Lose 1-2 lbs per week. Get referral for reassessment for CPAP machine Schedule appt about bariatric surgery in [place] at [facility]. Work on cutting out soda and tea and drink more water  Teaching Method Utilized:  S[employer] Hands on  Handouts given during visit include:  The Plate Method   Meal Plan Card    Barriers to learning/adherence to lifestyle change: none  Demonstrated degree of understanding via:  Teach Back   Monitoring/Evaluation:  Dietary intake, exercise, meal planning, and body weight in 3 month(s).

## 2020-06-07 NOTE — Patient Instructions (Addendum)
Goals  To get labs at [facility] this week if possible.  Re sign up for [employer]  Work out at [employer] 3 times per week. Lose 1-2 lbs per week. Get referral for reassessment for CPAP machine Schedule appt about bariatric surgery in [place] at [facility]. Work on cutting out soda and tea and drink more water

## 2020-07-08 ENCOUNTER — Other Ambulatory Visit: Payer: Self-pay

## 2020-07-08 ENCOUNTER — Ambulatory Visit
Admission: EM | Admit: 2020-07-08 | Discharge: 2020-07-08 | Disposition: A | Discharge status: Home / Self Care | Payer: Medicare Other | Attending: Family Medicine | Admitting: Family Medicine

## 2020-07-08 DIAGNOSIS — L03116 Cellulitis of left lower limb: Secondary | ICD-10-CM | POA: Diagnosis not present

## 2020-07-08 MED ORDER — DOXYCYCLINE HYCLATE 100 MG PO TABS: 100.0000 mg | ORAL_TABLET | Freq: Two times a day (BID) | ORAL | 0 refills | Status: AC

## 2020-07-08 NOTE — ED Triage Notes (Signed)
Pt also reports nausea and headache

## 2020-07-08 NOTE — ED Triage Notes (Signed)
Pt presents with cellulitis to left lower leg, redness and edema noted

## 2020-07-08 NOTE — Discharge Instructions (Signed)
Take the doxycycline as prescribed. Follow up as needed for continued or worsening symptoms

## 2020-07-08 NOTE — ED Provider Notes (Signed)
[facility]    CSN: [id number] Arrival date & time: 07/08/20  1016      History   Chief Complaint Chief Complaint  Patient presents with  . Cellulitis    HPI John [name] is a 54 y.o. male.   Patient is a 54 year old male with past medical history of arthritis, cellulitis, GERD, hypertension, MRSA, sleep apnea. He presents today with worsening left lower extremity pain, erythema and swelling over the last 3 days. He has had some low-grade fever mild nausea and headache. History of cellulitis in the past. Consistent with this. Denies any injuries to the leg.     Past Medical History:  Diagnosis Date  . Arthritis   . Bright's disease    history of at age 3  . Cellulitis   . GERD (gastroesophageal reflux disease)   . History of kidney stones   . Hypertension   . MRSA (methicillin resistant Staphylococcus aureus)   . Seizures ([place])    at age 36 when brights disease was discoved; 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 no Seizures since then and on no meds.  . Sleep apnea    uses CPAP    Patient Active Problem List   Diagnosis Date Noted  . OBESITY 03/29/2010  . CHEST PAIN 03/29/2010  . SLEEP APNEA 05/15/2009    Past Surgical History:  Procedure Laterality Date  . APPENDECTOMY    . CYSTOSCOPY WITH STENT PLACEMENT Left 08/28/2016   Procedure: CYSTOSCOPY WITH LEFT URETERAL STENT PLACEMENT;  Surgeon: Cleon Gustin, MD;  Location: [facility];  Service: Urology;  Laterality: Left;  . CYSTOSCOPY WITH STENT PLACEMENT Left 09/18/2016   Procedure: CYSTOSCOPY WITH STENT PLACEMENT;  Surgeon: Cleon Gustin, MD;  Location: [facility];  Service: Urology;  Laterality: Left;  . CYSTOSCOPY/RETROGRADE/URETEROSCOPY/STONE EXTRACTION WITH BASKET Left 08/28/2016   Procedure: CYSTOSCOPYLEFT RETROGRADE PYELOGRAM, DIAGNOSITIC LEFT URETEROSCOPY;  Surgeon: Cleon Gustin, MD;  Location: [facility];  Service: Urology;  Laterality: Left;  . CYSTOSCOPY/RETROGRADE/URETEROSCOPY/STONE  EXTRACTION WITH BASKET Left 09/18/2016   Procedure: CYSTOSCOPY/RETROGRADE/URETEROSCOPY/STONE EXTRACTION WITH BASKET;  Surgeon: Cleon Gustin, MD;  Location: [facility];  Service: Urology;  Laterality: Left;  . HOLMIUM LASER APPLICATION Left 83/01/8249   Procedure: HOLMIUM LASER APPLICATION;  Surgeon: Cleon Gustin, MD;  Location: [facility];  Service: Urology;  Laterality: Left;  . KNEE ARTHROSCOPY Left    x2  . MOUTH SURGERY Left    jawx2' has plate and bone graft of jaw; graft was from hip.  Marland Kitchen SHOULDER SURGERY Left    trimmed cartiledge       Home Medications    Prior to Admission medications   Medication Sig Start Date End Date Taking? Authorizing Provider  aspirin 325 MG tablet Take 325 mg by mouth daily.      [provider]  benzonatate (TESSALON) 100 MG capsule Take 1 capsule (100 mg total) by mouth every 8 (eight) hours. 10/25/16   Fransico Meadow, PA-C  cetirizine (ZYRTEC) 10 MG tablet Take 10 mg by mouth daily as needed for allergies.     [provider]  clobetasol (TEMOVATE) 0.05 % cream Apply 1 application topically 2 (two) times daily as needed (for cellulitis).     [provider]  Cyanocobalamin (VITAMIN B 12 PO) Take 1 tablet by mouth 3 (three) times a week.     [provider]  cyclobenzaprine (FLEXERIL) 10 MG tablet Take 10 mg by mouth 3 (three) times daily as needed for muscle spasms.  [provider]  diltiazem (CARDIZEM LA) 240 MG 24 hr tablet Take 240 mg by mouth daily.    [provider]  doxycycline (VIBRA-TABS) 100 MG tablet Take 1 tablet (100 mg total) by mouth 2 (two) times daily for 10 days. 07/08/20 07/18/20  Loura Halt A, NP  Glucosamine 500 MG CAPS Take by mouth.    [provider]  hydrochlorothiazide (HYDRODIURIL) 25 MG tablet Take 25 mg by mouth daily.    [provider]  LORazepam (ATIVAN) 1 MG tablet Take 0.5 mg by mouth every 8 (eight) hours.    [provider]    metoprolol succinate (TOPROL-XL) 50 MG 24 hr tablet Take 50 mg by mouth daily. Take with or immediately following a meal.    [provider]  omeprazole (PRILOSEC) 40 MG capsule Take 40 mg by mouth daily as needed (for acid reflux).  12/20/13   [provider]  ondansetron (ZOFRAN ODT) 4 MG disintegrating tablet Take 1 tablet (4 mg total) by mouth every 8 (eight) hours as needed for nausea or vomiting. Patient not taking: Reported on 01/18/2019 07/14/16   Anne Ng, PA-C  oxyCODONE-acetaminophen (PERCOCET/ROXICET) 5-325 MG tablet Take 1-2 tablets by mouth every 4 (four) hours as needed for severe pain. 09/18/16   McKenzie, Candee Furbish, MD  sulfamethoxazole-trimethoprim (BACTRIM DS,SEPTRA DS) 800-160 MG tablet Take 1 tablet by mouth 2 (two) times daily. Patient not taking: Reported on 01/18/2019 09/18/16   Cleon Gustin, MD  tamsulosin (FLOMAX) 0.4 MG CAPS capsule Take 1 capsule (0.4 mg total) by mouth daily. Patient not taking: Reported on 01/18/2019 08/28/16   Cleon Gustin, MD    Family History Family History  Problem Relation Age of Onset  . Diabetes Mother   . Cancer Mother   . Cancer Father   . Cancer Other     Social History Social History   Tobacco Use  . Smoking status: Never Smoker  . Smokeless tobacco: Never Used  Substance Use Topics  . Alcohol use: Yes    Comment: occasionally  . Drug use: No     Allergies   Penicillins   Review of Systems Review of Systems   Physical Exam Triage Vital Signs ED Triage Vitals  Enc Vitals Group     BP 07/08/20 1038 130/81     Pulse Rate 07/08/20 1038 75     Resp 07/08/20 1038 20     Temp 07/08/20 1038 99.2 F (37.3 C)     Temp src --      SpO2 07/08/20 1038 93 %     Weight --      Height --      Head Circumference --      Peak Flow --      Pain Score 07/08/20 1036 6     Pain Loc --      Pain Edu? --      Excl. in [place]? --    No data found.  Updated Vital Signs BP 130/81   Pulse 75   Temp  99.2 F (37.3 C)   Resp 20   SpO2 93%   Visual Acuity Right Eye Distance:   Left Eye Distance:   Bilateral Distance:    Right Eye Near:   Left Eye Near:    Bilateral Near:     Physical Exam Vitals and nursing note reviewed.  Constitutional:      Appearance: Normal appearance.  HENT:     Head: Normocephalic and atraumatic.  Nose: Nose normal.  Eyes:     Conjunctiva/sclera: Conjunctivae normal.  Pulmonary:     Effort: Pulmonary effort is normal.  Abdominal:     Palpations: Abdomen is soft.     Tenderness: There is no abdominal tenderness.  Musculoskeletal:        General: Normal range of motion.     Cervical back: Normal range of motion.     Right lower leg: Edema present.     Left lower leg: Edema present.     Comments: Bilateral lower extremity edema worse on left with generalized erythema, increased warmth and swelling   Skin:    General: Skin is warm and dry.  Neurological:     Mental Status: He is alert.  Psychiatric:        Mood and Affect: Mood normal.      UC Treatments / Results  Labs (all labs ordered are listed, but only abnormal results are displayed) Labs Reviewed - No data to display  EKG   Radiology No results found.  Procedures Procedures (including critical care time)  Medications Ordered in UC Medications - No data to display  Initial Impression / Assessment and Plan / UC Course  I have reviewed the triage vital signs and the nursing notes.  Pertinent labs & imaging results that were available during my care of the patient were reviewed by me and considered in my medical decision making (see chart for details).     Cellulitis of left leg Treating with doxycycline Follow up as needed for continued or worsening symptoms  Final Clinical Impressions(s) / UC Diagnoses   Final diagnoses:  Cellulitis of leg, left     Discharge Instructions     Take the doxycycline as prescribed. Follow up as needed for continued or  worsening symptoms     ED Prescriptions    Medication Sig Dispense Auth. Provider   doxycycline (VIBRA-TABS) 100 MG tablet Take 1 tablet (100 mg total) by mouth 2 (two) times daily for 10 days. 20 tablet Loura Halt A, NP     PDMP not reviewed this encounter.   Orvan July, NP 07/08/20 1059

## 2020-09-05 NOTE — H&P (Signed)
HISTORY AND PHYSICAL  John [name] is a 54 y.o. male patient with CC: painful teeth.  No diagnosis found.  Past Medical History:  Diagnosis Date  . Arthritis   . Bright's disease    history of at age 25  . Cellulitis   . GERD (gastroesophageal reflux disease)   . History of kidney stones   . Hypertension   . MRSA (methicillin resistant Staphylococcus aureus)   . Seizures ([place])    at age 31 when brights disease was discoved; 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 no Seizures since then and on no meds.  . Sleep apnea    uses CPAP    No current facility-administered medications for this encounter.   Current Outpatient Medications  Medication Sig Dispense Refill  . aspirin 325 MG tablet Take 325 mg by mouth daily.      . benzonatate (TESSALON) 100 MG capsule Take 1 capsule (100 mg total) by mouth every 8 (eight) hours. 21 capsule 0  . cetirizine (ZYRTEC) 10 MG tablet Take 10 mg by mouth daily as needed for allergies.     . clobetasol (TEMOVATE) 0.05 % cream Apply 1 application topically 2 (two) times daily as needed (for cellulitis).     . Cyanocobalamin (VITAMIN B 12 PO) Take 1 tablet by mouth 3 (three) times a week.     . cyclobenzaprine (FLEXERIL) 10 MG tablet Take 10 mg by mouth 3 (three) times daily as needed for muscle spasms.     Marland Kitchen diltiazem (CARDIZEM LA) 240 MG 24 hr tablet Take 240 mg by mouth daily.    . Glucosamine 500 MG CAPS Take by mouth.    . hydrochlorothiazide (HYDRODIURIL) 25 MG tablet Take 25 mg by mouth daily.    Marland Kitchen LORazepam (ATIVAN) 1 MG tablet Take 0.5 mg by mouth every 8 (eight) hours.    . metoprolol succinate (TOPROL-XL) 50 MG 24 hr tablet Take 50 mg by mouth daily. Take with or immediately following a meal.    . omeprazole (PRILOSEC) 40 MG capsule Take 40 mg by mouth daily as needed (for acid reflux).     . ondansetron (ZOFRAN ODT) 4 MG disintegrating tablet Take 1 tablet (4 mg total) by mouth every 8 (eight) hours as needed for nausea or vomiting. (Patient  not taking: Reported on 01/18/2019) 20 tablet 0  . oxyCODONE-acetaminophen (PERCOCET/ROXICET) 5-325 MG tablet Take 1-2 tablets by mouth every 4 (four) hours as needed for severe pain. 30 tablet 0  . sulfamethoxazole-trimethoprim (BACTRIM DS,SEPTRA DS) 800-160 MG tablet Take 1 tablet by mouth 2 (two) times daily. (Patient not taking: Reported on 01/18/2019) 6 tablet 0  . tamsulosin (FLOMAX) 0.4 MG CAPS capsule Take 1 capsule (0.4 mg total) by mouth daily. (Patient not taking: Reported on 01/18/2019) 30 capsule 0   Allergies  Allergen Reactions  . Penicillins Rash and Other (See Comments)    Has patient had a PCN reaction causing immediate rash, facial/tongue/throat swelling, SOB or lightheadedness with hypotension: No Has patient had a PCN reaction causing severe rash involving mucus membranes or skin necrosis: No Has patient had a PCN reaction that required hospitalization No Has patient had a PCN reaction occurring within the last 10 years: No If all of the above answers are "NO", then may proceed with Cephalosporin use.    Active Problems:   * No active hospital problems. *  Vitals: There were no vitals taken for this visit. Lab results:No results found for this or any previous visit (from the past 40  hour(s)). Radiology Results: No results found. General appearance: alert, cooperative, no distress and morbidly obese Head: Normocephalic, without obvious abnormality, atraumatic Eyes: negative Nose: Nares normal. Septum midline. Mucosa normal. No drainage or sinus tenderness. Throat: Multiple dental caries, Bilateral mandibular tori. No purulencde, edema, trismuis. Pharynx clear.  Neck: no adenopathy and supple, symmetrical, trachea midline Resp: clear to auscultation bilaterally Cardio: regular rate and rhythm, S1, S2 normal, no murmur, click, rub or gallop  Assessment: Multiple non-restorable teeth secondary to dent\al caries. Bilateral mandibular lingual tori.  Plan: Multiple dental  extractions with alveoloplasty. Removal lingual tori. GA, Day surgery.   Diona Browner 09/05/2020

## 2020-09-07 ENCOUNTER — Ambulatory Visit: Payer: Medicare Other | Admitting: Nutrition

## 2020-09-12 ENCOUNTER — Other Ambulatory Visit (HOSPITAL_COMMUNITY)
Admission: RE | Admit: 2020-09-12 | Discharge: 2020-09-12 | Disposition: A | Discharge status: Home / Self Care | Payer: Medicare Other | Source: Ambulatory Visit | Attending: Oral Surgery | Admitting: Oral Surgery

## 2020-09-12 DIAGNOSIS — Z20822 Contact with and (suspected) exposure to covid-19: Secondary | ICD-10-CM | POA: Diagnosis not present

## 2020-09-12 DIAGNOSIS — Z01812 Encounter for preprocedural laboratory examination: Secondary | ICD-10-CM | POA: Insufficient documentation

## 2020-09-12 LAB — SARS CORONAVIRUS 2 (TAT 6-24 HRS): SARS Coronavirus 2: NEGATIVE

## 2020-09-13 ENCOUNTER — Other Ambulatory Visit: Payer: Self-pay

## 2020-09-13 ENCOUNTER — Encounter (HOSPITAL_COMMUNITY): Payer: Self-pay | Admitting: Oral Surgery

## 2020-09-13 NOTE — Progress Notes (Signed)
PCP - Dr. Rudene Christians  Cardiologist - Denies  Chest x-ray - Denies  EKG - 09/15/20 (DOS)  Stress Test - Denies  ECHO - 01/06/2009 (E)  Cardiac Cath - Denies  AICD-na PM-na LOOP-na  Sleep Study - Yes- Positive CPAP - Yes  LABS- 09/12/20: COVID 09/15/20: CBC, BMP, PCR  ASA- LD- 10/26  ERAS-No  HA1C- Denies  Anesthesia- No  Pt denies having chest pain, sob, or fever during the pre-op phone call. All instructions explained to the pt, with a verbal understanding including: as of today, stop taking all Aspirin (unless instructed by your doctor) and Other Aspirin containing products, Vitamins, Fish oils, and Herbal medications. Also stop all NSAIDS i.e. Advil, Ibuprofen, Motrin, Aleve, Anaprox, Naproxen, BC, Goody Powders, and all Supplements.  Pt also instructed to continue to self quarantine after being tested for COVID-19. The opportunity to ask questions was provided.   Coronavirus Screening  Have you experienced the following symptoms:  Cough yes/no: No Fever (>100.41F)  yes/no: No Runny nose yes/no: No Sore throat yes/no: No Difficulty breathing/shortness of breath  yes/no: No  Have you or a family member traveled in the last 14 days and where? yes/no: No   If the patient indicates "YES" to the above questions, their PAT will be rescheduled to limit the exposure to others and, the surgeon will be notified. THE PATIENT WILL NEED TO BE ASYMPTOMATIC FOR 14 DAYS.   If the patient is not experiencing any of these symptoms, the PAT nurse will instruct them to NOT bring anyone with them to their appointment since they may have these symptoms or traveled as well.   Please remind your patients and families that hospital visitation restrictions are in effect and the importance of the restrictions.

## 2020-09-15 ENCOUNTER — Ambulatory Visit (HOSPITAL_COMMUNITY): Payer: Medicare Other | Admitting: Anesthesiology

## 2020-09-15 ENCOUNTER — Encounter (HOSPITAL_COMMUNITY)
Admission: RE | Disposition: A | Discharge status: Home / Self Care | Payer: Self-pay | Source: Home / Self Care | Attending: Oral Surgery

## 2020-09-15 ENCOUNTER — Ambulatory Visit (HOSPITAL_COMMUNITY)
Admission: RE | Admit: 2020-09-15 | Discharge: 2020-09-15 | Disposition: A | Discharge status: Home / Self Care | Payer: Medicare Other | Attending: Oral Surgery | Admitting: Oral Surgery

## 2020-09-15 ENCOUNTER — Other Ambulatory Visit: Payer: Self-pay

## 2020-09-15 ENCOUNTER — Encounter (HOSPITAL_COMMUNITY): Payer: Self-pay | Admitting: Oral Surgery

## 2020-09-15 DIAGNOSIS — M270 Developmental disorders of jaws: Secondary | ICD-10-CM | POA: Diagnosis not present

## 2020-09-15 DIAGNOSIS — K029 Dental caries, unspecified: Secondary | ICD-10-CM | POA: Insufficient documentation

## 2020-09-15 HISTORY — PX: TOOTH EXTRACTION: SHX859

## 2020-09-15 LAB — BASIC METABOLIC PANEL
Anion gap: 9 (ref 5–15)
BUN: 17 mg/dL (ref 6–20)
CO2: 29 mmol/L (ref 22–32)
Calcium: 8.9 mg/dL (ref 8.9–10.3)
Chloride: 99 mmol/L (ref 98–111)
Creatinine, Ser: 1.44 mg/dL — ABNORMAL HIGH (ref 0.61–1.24)
GFR, Estimated: 57 mL/min — ABNORMAL LOW (ref >60)
Glucose, Bld: 114 mg/dL — ABNORMAL HIGH (ref 70–99)
Potassium: 4.1 mmol/L (ref 3.5–5.1)
Sodium: 137 mmol/L (ref 135–145)

## 2020-09-15 LAB — CBC
HCT: 47.9 % (ref 39.0–52.0)
Hemoglobin: 15.2 g/dL (ref 13.0–17.0)
MCH: 28.2 pg (ref 26.0–34.0)
MCHC: 31.7 g/dL (ref 30.0–36.0)
MCV: 88.9 fL (ref 80.0–100.0)
Platelets: 206 10*3/uL (ref 150–400)
RBC: 5.39 MIL/uL (ref 4.22–5.81)
RDW: 13.7 % (ref 11.5–15.5)
WBC: 9.4 10*3/uL (ref 4.0–10.5)
nRBC: 0 % (ref 0.0–0.2)

## 2020-09-15 SURGERY — DENTAL RESTORATION/EXTRACTIONS
Anesthesia: General | Laterality: Bilateral

## 2020-09-15 MED ORDER — ONDANSETRON HCL 4 MG/2ML IJ SOLN
INTRAMUSCULAR | Status: AC
  Filled 2020-09-15: qty 2

## 2020-09-15 MED ORDER — MEPERIDINE HCL 25 MG/ML IJ SOLN: 6.2500 mg | INTRAMUSCULAR | Status: DC | PRN

## 2020-09-15 MED ORDER — CLINDAMYCIN PHOSPHATE 900 MG/50ML IV SOLN
900.0000 mg | INTRAVENOUS | Status: AC
  Administered 2020-09-15: 900 mg via INTRAVENOUS
  Filled 2020-09-15: qty 50

## 2020-09-15 MED ORDER — MIDAZOLAM HCL 2 MG/2ML IJ SOLN: 0.5000 mg | Freq: Once | INTRAMUSCULAR | Status: DC | PRN

## 2020-09-15 MED ORDER — EPHEDRINE SULFATE-NACL 50-0.9 MG/10ML-% IV SOSY
PREFILLED_SYRINGE | INTRAVENOUS | Status: DC | PRN
  Administered 2020-09-15 (×5): 5 mg via INTRAVENOUS
  Administered 2020-09-15: 15 mg via INTRAVENOUS
  Administered 2020-09-15: 10 mg via INTRAVENOUS

## 2020-09-15 MED ORDER — PROPOFOL 10 MG/ML IV BOLUS
INTRAVENOUS | Status: AC
  Filled 2020-09-15: qty 20

## 2020-09-15 MED ORDER — PROPOFOL 10 MG/ML IV BOLUS
INTRAVENOUS | Status: DC | PRN
  Administered 2020-09-15: 200 mg via INTRAVENOUS

## 2020-09-15 MED ORDER — OXYCODONE HCL 5 MG PO TABS: 5.0000 mg | ORAL_TABLET | Freq: Once | ORAL | Status: DC | PRN

## 2020-09-15 MED ORDER — ROCURONIUM BROMIDE 10 MG/ML (PF) SYRINGE
PREFILLED_SYRINGE | INTRAVENOUS | Status: AC
  Filled 2020-09-15: qty 10

## 2020-09-15 MED ORDER — HYDROMORPHONE HCL 1 MG/ML IJ SOLN
0.2500 mg | INTRAMUSCULAR | Status: DC | PRN
  Administered 2020-09-15: 0.5 mg via INTRAVENOUS

## 2020-09-15 MED ORDER — PROMETHAZINE HCL 25 MG/ML IJ SOLN: 6.2500 mg | INTRAMUSCULAR | Status: DC | PRN

## 2020-09-15 MED ORDER — LIDOCAINE-EPINEPHRINE 2 %-1:100000 IJ SOLN
INTRAMUSCULAR | Status: AC
  Filled 2020-09-15: qty 1

## 2020-09-15 MED ORDER — ORAL CARE MOUTH RINSE: 15.0000 mL | Freq: Once | OROMUCOSAL | Status: AC

## 2020-09-15 MED ORDER — 0.9 % SODIUM CHLORIDE (POUR BTL) OPTIME
TOPICAL | Status: DC | PRN
  Administered 2020-09-15: 1000 mL

## 2020-09-15 MED ORDER — GLYCOPYRROLATE PF 0.2 MG/ML IJ SOSY
PREFILLED_SYRINGE | INTRAMUSCULAR | Status: DC | PRN
  Administered 2020-09-15: .2 mg via INTRAVENOUS

## 2020-09-15 MED ORDER — FENTANYL CITRATE (PF) 250 MCG/5ML IJ SOLN
INTRAMUSCULAR | Status: AC
  Filled 2020-09-15: qty 5

## 2020-09-15 MED ORDER — ROCURONIUM BROMIDE 10 MG/ML (PF) SYRINGE
PREFILLED_SYRINGE | INTRAVENOUS | Status: DC | PRN
  Administered 2020-09-15: 70 mg via INTRAVENOUS

## 2020-09-15 MED ORDER — SUGAMMADEX SODIUM 200 MG/2ML IV SOLN
INTRAVENOUS | Status: DC | PRN
  Administered 2020-09-15: 200 mg via INTRAVENOUS

## 2020-09-15 MED ORDER — LIDOCAINE 2% (20 MG/ML) 5 ML SYRINGE
INTRAMUSCULAR | Status: AC
  Filled 2020-09-15: qty 5

## 2020-09-15 MED ORDER — OXYCODONE HCL 5 MG/5ML PO SOLN: 5.0000 mg | Freq: Once | ORAL | Status: DC | PRN

## 2020-09-15 MED ORDER — LIDOCAINE-EPINEPHRINE 2 %-1:100000 IJ SOLN
INTRAMUSCULAR | Status: DC | PRN
  Administered 2020-09-15: 20 mL via INTRADERMAL

## 2020-09-15 MED ORDER — DEXAMETHASONE SODIUM PHOSPHATE 10 MG/ML IJ SOLN
INTRAMUSCULAR | Status: DC | PRN
  Administered 2020-09-15: 10 mg via INTRAVENOUS

## 2020-09-15 MED ORDER — ONDANSETRON HCL 4 MG/2ML IJ SOLN
INTRAMUSCULAR | Status: DC | PRN
  Administered 2020-09-15: 4 mg via INTRAVENOUS

## 2020-09-15 MED ORDER — OXYCODONE HCL 5 MG PO TABS
5.0000 mg | ORAL_TABLET | Freq: Four times a day (QID) | ORAL | 0 refills | Status: DC | PRN
Start: 2020-09-15 — End: 2022-08-28

## 2020-09-15 MED ORDER — SODIUM CHLORIDE 0.9 % IR SOLN
Status: DC | PRN
  Administered 2020-09-15: 1000 mL

## 2020-09-15 MED ORDER — EPHEDRINE 5 MG/ML INJ
INTRAVENOUS | Status: AC
  Filled 2020-09-15: qty 10

## 2020-09-15 MED ORDER — OXYMETAZOLINE HCL 0.05 % NA SOLN
NASAL | Status: AC
  Filled 2020-09-15: qty 30

## 2020-09-15 MED ORDER — MIDAZOLAM HCL 2 MG/2ML IJ SOLN
INTRAMUSCULAR | Status: DC | PRN
  Administered 2020-09-15: 2 mg via INTRAVENOUS

## 2020-09-15 MED ORDER — LACTATED RINGERS IV SOLN: INTRAVENOUS | Status: DC

## 2020-09-15 MED ORDER — CLINDAMYCIN HCL 300 MG PO CAPS: 300.0000 mg | ORAL_CAPSULE | Freq: Three times a day (TID) | ORAL | 0 refills | Status: DC

## 2020-09-15 MED ORDER — LIDOCAINE 2% (20 MG/ML) 5 ML SYRINGE
INTRAMUSCULAR | Status: DC | PRN
  Administered 2020-09-15: 20 mg via INTRAVENOUS

## 2020-09-15 MED ORDER — CHLORHEXIDINE GLUCONATE 0.12 % MT SOLN
15.0000 mL | Freq: Once | OROMUCOSAL | Status: AC
  Administered 2020-09-15: 15 mL via OROMUCOSAL
  Filled 2020-09-15: qty 15

## 2020-09-15 MED ORDER — DEXAMETHASONE SODIUM PHOSPHATE 10 MG/ML IJ SOLN
INTRAMUSCULAR | Status: AC
  Filled 2020-09-15: qty 1

## 2020-09-15 MED ORDER — OXYMETAZOLINE HCL 0.05 % NA SOLN
NASAL | Status: DC | PRN
  Administered 2020-09-15: 1

## 2020-09-15 MED ORDER — MIDAZOLAM HCL 2 MG/2ML IJ SOLN
INTRAMUSCULAR | Status: AC
  Filled 2020-09-15: qty 2

## 2020-09-15 MED ORDER — HYDROMORPHONE HCL 1 MG/ML IJ SOLN
INTRAMUSCULAR | Status: AC
  Filled 2020-09-15: qty 1

## 2020-09-15 MED ORDER — GLYCOPYRROLATE PF 0.2 MG/ML IJ SOSY
PREFILLED_SYRINGE | INTRAMUSCULAR | Status: AC
  Filled 2020-09-15: qty 1

## 2020-09-15 SURGICAL SUPPLY — 31 items
BLADE SURG 15 STRL LF DISP TIS (BLADE) ×1 IMPLANT
BLADE SURG 15 STRL SS (BLADE) ×3
BUR CROSS CUT FISSURE 1.6 (BURR) ×2 IMPLANT
BUR CROSS CUT FISSURE 1.6MM (BURR) ×1
BUR EGG ELITE 4.0 (BURR) ×3 IMPLANT
BUR EGG ELITE 4.0MM (BURR) ×2
CANISTER SUCT 3000ML PPV (MISCELLANEOUS) ×3 IMPLANT
COVER SURGICAL LIGHT HANDLE (MISCELLANEOUS) ×3 IMPLANT
DECANTER SPIKE VIAL GLASS SM (MISCELLANEOUS) ×3 IMPLANT
DRAPE U-SHAPE 76X120 STRL (DRAPES) ×3 IMPLANT
GAUZE PACKING FOLDED 2  STR (GAUZE/BANDAGES/DRESSINGS) ×3
GAUZE PACKING FOLDED 2 STR (GAUZE/BANDAGES/DRESSINGS) ×1 IMPLANT
GLOVE BIO SURGEON STRL SZ8 (GLOVE) ×3 IMPLANT
GOWN STRL REUS W/ TWL LRG LVL3 (GOWN DISPOSABLE) ×1 IMPLANT
GOWN STRL REUS W/ TWL XL LVL3 (GOWN DISPOSABLE) ×1 IMPLANT
GOWN STRL REUS W/TWL LRG LVL3 (GOWN DISPOSABLE) ×3
GOWN STRL REUS W/TWL XL LVL3 (GOWN DISPOSABLE) ×3
IV NS 1000ML (IV SOLUTION) ×3
IV NS 1000ML BAXH (IV SOLUTION) ×1 IMPLANT
KIT BASIN OR (CUSTOM PROCEDURE TRAY) ×3 IMPLANT
KIT TURNOVER KIT B (KITS) ×3 IMPLANT
NDL HYPO 25GX1X1/2 BEV (NEEDLE) ×2 IMPLANT
NEEDLE HYPO 25GX1X1/2 BEV (NEEDLE) ×6 IMPLANT
NS IRRIG 1000ML POUR BTL (IV SOLUTION) ×3 IMPLANT
PAD ARMBOARD 7.5X6 YLW CONV (MISCELLANEOUS) ×3 IMPLANT
SLEEVE IRRIGATION ELITE 7 (MISCELLANEOUS) ×3 IMPLANT
SUT CHROMIC 3 0 PS 2 (SUTURE) ×5 IMPLANT
SYR CONTROL 10ML LL (SYRINGE) ×3 IMPLANT
TRAY ENT MC OR (CUSTOM PROCEDURE TRAY) ×3 IMPLANT
TUBING IRRIGATION (MISCELLANEOUS) ×3 IMPLANT
YANKAUER SUCT BULB TIP NO VENT (SUCTIONS) ×3 IMPLANT

## 2020-09-15 NOTE — Transfer of Care (Signed)
Immediate Anesthesia Transfer of Care Note  Patient: John [name]  Procedure(s) Performed: DENTAL RESTORATION/EXTRACTIONS (Bilateral )  Patient Location: PACU  Anesthesia Type:General  Level of Consciousness: awake, patient cooperative and responds to stimulation  Airway & Oxygen Therapy: Patient Spontanous Breathing and Patient connected to face mask oxygen  Post-op Assessment: Report given to RN, Post -op Vital signs reviewed and stable and Patient moving all extremities X 4  Post vital signs: Reviewed and stable  Last Vitals:  Vitals Value Taken Time  BP 139/106 09/15/20 1101  Temp    Pulse 98 09/15/20 1102  Resp 21 09/15/20 1102  SpO2 94 % 09/15/20 1102  Vitals shown include unvalidated device data.  Last Pain:  Vitals:   09/15/20 0701  PainSc: 4       Patients Stated Pain Goal: 4 (83/81/84 0375)  Complications: No complications documented.

## 2020-09-15 NOTE — H&P (Signed)
H&P documentation  -History and Physical Reviewed  -Patient has been re-examined  -No change in the plan of care  John [name]  

## 2020-09-15 NOTE — Anesthesia Postprocedure Evaluation (Signed)
Anesthesia Post Note  Patient: John [name]  Procedure(s) Performed: DENTAL RESTORATION/EXTRACTIONS (Bilateral )     Patient location during evaluation: PACU Anesthesia Type: General Level of consciousness: awake and alert, patient cooperative and oriented Pain management: pain level controlled Vital Signs Assessment: post-procedure vital signs reviewed and stable Respiratory status: spontaneous breathing, nonlabored ventilation and respiratory function stable Cardiovascular status: blood pressure returned to baseline and stable Postop Assessment: no apparent nausea or vomiting Anesthetic complications: no   No complications documented.  Last Vitals:  Vitals:   09/15/20 1130 09/15/20 1145  BP: 118/78 104/82  Pulse: 97 92  Resp: 16 17  Temp:  36.4 C  SpO2: 93% 94%    Last Pain:  Vitals:   09/15/20 1145  PainSc: 2                  Leyton Brownlee,E. Dotsie Gillette

## 2020-09-15 NOTE — Anesthesia Procedure Notes (Signed)
Procedure Name: Intubation Date/Time: 09/15/2020 9:45 AM Performed by: Michele Rockers, CRNA Pre-anesthesia Checklist: Patient identified, Patient being monitored, Timeout performed, Emergency Drugs available and Suction available Patient Re-evaluated:Patient Re-evaluated prior to induction Oxygen Delivery Method: Circle System Utilized Preoxygenation: Pre-oxygenation with 100% oxygen Induction Type: IV induction Ventilation: Mask ventilation without difficulty Laryngoscope Size: Mac, 3 and Glidescope Grade View: Grade I Nasal Tubes: Nasal prep performed, Right, Magill forceps - small, utilized and Nasal Rae Tube size: 7.5 mm Number of attempts: 1 Placement Confirmation: ETT inserted through vocal cords under direct vision,  positive ETCO2 and breath sounds checked- equal and bilateral Secured at: 21 cm Tube secured with: Tape Dental Injury: Teeth and Oropharynx as per pre-operative assessment

## 2020-09-15 NOTE — Op Note (Signed)
NAME: John [name], John [name] MEDICAL RECORD OX:[id number] ACCOUNT [id number] DATE OF BIRTH:02-03-1966 FACILITY: [facility] LOCATION: [facility] PHYSICIAN:Meegan Shanafelt M. Dia Jefferys, DDS  OPERATIVE REPORT  DATE OF PROCEDURE:  09/15/2020  PREOPERATIVE DIAGNOSES:  Nonrestorable teeth numbers 2, 3, 5, 6, 7, 8, 9, 13, 18, 26, 27, 28, 31; bilateral mandibular lingual tori.  PROCEDURE:  Extraction of teeth numbers 2, 3, 5, 6, 7, 8, 9, 13, 18 26, 27, 28, 31; alveoplasty right and left maxilla and mandible; removal of bilateral mandibular lingual tori.  SURGEON:  Diona Browner, DDS  ANESTHESIA:  General, nasal intubation.  ATTENDING:  Annye Asa.  DESCRIPTION OF PROCEDURE:  The patient was taken to the operating room and placed on the table in supine position.  General anesthesia was administered intravenously and a nasal endotracheal tube was placed and secured.  The eyes were protected.  The  patient was draped for surgery.  A timeout was performed.  The posterior pharynx was suctioned and a throat pack was placed, 2% lidocaine and 1:100,000 epinephrine was infiltrated in an inferior alveolar block on the right and left sides and in buccal  and palatal infiltration around the maxillary teeth to be removed.  A bite block was placed on the right side of the mouth and a sweetheart retractor was used to retract the tongue.  A #15 blade was used to make an incision around tooth #18 and then  carried forward along the alveolar crest until tooth numbers 26, 27, 28 were encountered.  At that point, the incision was created in the buccal and lingual sulcus around these anterior teeth.  The periosteum was reflected.  The periosteum was extremely  tenuous, adherent to the bone and the left mandible was slanted outward to make a clean dissection difficult.  There were some perforations in the tissue, _____ the reflection was completed.  Then, the tooth #18 was elevated and using a dental forceps,  this tooth was extracted.   Teeth numbers 26, 27, 28 were elevated with a 301 elevator and extracted.  Additional bone was removed around tooth #27 as a portion of the tooth crumbled on initial attempt at removal with the forceps.  Then, once these teeth  were removed, alveoplasty was performed using an egg-shaped bur followed by the bone file.  The lingual torus on the left side was predominantly in the posterior area around tooth #18 where a large bulbous portion of the lingual plate caused an undercut  underneath the bone.  This was removed with the egg bur under irrigation and then the area was filed with a bone file and closed with 3-0 chromic.  Then, the left maxilla was operated.  The 15 blade was used to make an incision beginning at tooth #13 and  carried forward to tooth numbers 9, 8, 7.  The periosteum was reflected.  The teeth were elevated and removed with the dental forceps.  Alveoplasty was performed using the egg bur followed by the bone file.  Then, the area was irrigated and closed with  3-0 chromic.  Then, the bite block was repositioned to the other side of the mouth.  Tooth #18 was removed.  A 15 blade used to make an incision around tooth #18, carried forward to the anterior mandible at tooth #6 along the alveolar crest.  The  periosteum was reflected.  Tooth #18 could not be removed with the forceps, so the Stryker handpiece with a fissure bur was used to remove bone from around the tooth and the tooth was  removed with forceps.  The periosteum was reflected to expose the  alveolar ridge and the lingual tuberosity.  The alveoplasty was performed and the tuberosity was removed using the egg bur followed by the bone file.  This area was then irrigated and closed with 3-0 chromic.  In the right maxilla, the 15 blade used to  make an incision around teeth numbers 2, 3, 5 and 6.  The periosteum was reflected from around these teeth.  Tooth #2 was elevated and removed with the forceps.  Tooth #3 with multiple root  fragments that were partially submucosal and the Stryker  handpiece was used to separate these root fragments and then they were removed with a rongeur.  Tooth #5 was removed with the forceps and tooth #6 required removal of bone around the circumference of the tooth before the tooth could be removed with the  forceps.  Then, the periosteum was reflected to expose the alveolar crest, which was irregular in contour and alveoplasty was performed using the egg bur followed by the bone file.  Then, the oral cavity was irrigated, suctioned and a throat pack was  removed.  The patient was left in care of anesthesia for extubation, transported to recovery with plans for discharge home through day surgery.  ESTIMATED BLOOD LOSS:  Minimal.  COMPLICATIONS:  None.  HN/NUANCE  D:09/15/2020 T:09/15/2020 JOB:[id number]

## 2020-09-15 NOTE — Anesthesia Preprocedure Evaluation (Addendum)
Anesthesia Evaluation  Patient identified by MRN, date of birth, ID band Patient awake    Reviewed: Allergy & Precautions, NPO status , Patient's Chart, lab work & pertinent test results, reviewed documented beta blocker date and time   History of Anesthesia Complications Negative for: history of anesthetic complications  Airway Mallampati: II  TM Distance: >3 FB Neck ROM: Full    Dental  (+) Poor Dentition, Dental Advisory Given, Chipped, Missing   Pulmonary sleep apnea and Continuous Positive Airway Pressure Ventilation ,  09/12/2020 SARS coronavirus NEG   breath sounds clear to auscultation       Cardiovascular hypertension, Pt. on medications and Pt. on home beta blockers (-) angina Rhythm:Regular Rate:Normal     Neuro/Psych Seizures -, Well Controlled,     GI/Hepatic negative GI ROS, Neg liver ROS,   Endo/Other  Morbid obesity  Renal/GU Renal InsufficiencyRenal disease     Musculoskeletal  (+) Arthritis ,   Abdominal (+) + obese,   Peds  Hematology negative hematology ROS (+)   Anesthesia Other Findings   Reproductive/Obstetrics                            Anesthesia Physical Anesthesia Plan  ASA: III  Anesthesia Plan: General   Post-op Pain Management:    Induction: Intravenous  PONV Risk Score and Plan: 2 and Ondansetron and Dexamethasone  Airway Management Planned: Nasal ETT  Additional Equipment: None  Intra-op Plan:   Post-operative Plan: Extubation in OR  Informed Consent: I have reviewed the patients History and Physical, chart, labs and discussed the procedure including the risks, benefits and alternatives for the proposed anesthesia with the patient or authorized representative who has indicated his/her understanding and acceptance.     Dental advisory given  Plan Discussed with: CRNA and Surgeon  Anesthesia Plan Comments:        Anesthesia Quick  Evaluation

## 2020-09-15 NOTE — Op Note (Signed)
09/15/2020  10:43 AM  PATIENT:  John [name]  54 y.o. male  PRE-OPERATIVE DIAGNOSIS:  NONRESTORABLE TEETH # 2, 3, 5, 6, 7, 8, 9, 13, 18, 26, 27, 28, 31, BILATERAL MANDIBULAR LINGUAL TORI  POST-OPERATIVE DIAGNOSIS:  SAME  PROCEDURE:  Procedure(s): EXTRACTION TEETH # 2, 3, 5, 6, 7, 8, 9, 13, 18, 26, 27, 28, 31, ALVEOLOPLASTY MAXILLA AND MANDIBLE, REMOVAL BILATERAL MANDIBULAR LINGUAL TORI   SURGEON:  Surgeon(s): Diona Browner, DDS  ANESTHESIA:   local and general  EBL:  minimal  DRAINS: none   SPECIMEN:  No Specimen  COUNTS:  YES  PLAN OF CARE: Discharge to home after PACU  PATIENT DISPOSITION:  PACU - hemodynamically stable.   PROCEDURE DETAILS: Dictation # [id number]  Gae Bon, DMD 09/15/2020 10:43 AM

## 2020-09-16 ENCOUNTER — Encounter (HOSPITAL_COMMUNITY): Payer: Self-pay | Admitting: Oral Surgery

## 2020-10-28 ENCOUNTER — Emergency Department (HOSPITAL_COMMUNITY)
Admission: EM | Admit: 2020-10-28 | Discharge: 2020-10-28 | Disposition: A | Discharge status: Home / Self Care | Payer: Medicare Other | Attending: Emergency Medicine | Admitting: Emergency Medicine

## 2020-10-28 ENCOUNTER — Emergency Department (HOSPITAL_COMMUNITY): Payer: Medicare Other

## 2020-10-28 ENCOUNTER — Other Ambulatory Visit: Payer: Self-pay

## 2020-10-28 DIAGNOSIS — I10 Essential (primary) hypertension: Secondary | ICD-10-CM | POA: Diagnosis not present

## 2020-10-28 DIAGNOSIS — R42 Dizziness and giddiness: Secondary | ICD-10-CM | POA: Insufficient documentation

## 2020-10-28 DIAGNOSIS — Z79899 Other long term (current) drug therapy: Secondary | ICD-10-CM | POA: Insufficient documentation

## 2020-10-28 DIAGNOSIS — R6884 Jaw pain: Secondary | ICD-10-CM | POA: Insufficient documentation

## 2020-10-28 DIAGNOSIS — Z7982 Long term (current) use of aspirin: Secondary | ICD-10-CM | POA: Diagnosis not present

## 2020-10-28 DIAGNOSIS — R000 Tachycardia, unspecified: Secondary | ICD-10-CM | POA: Insufficient documentation

## 2020-10-28 LAB — CBC
HCT: 52.1 % — ABNORMAL HIGH (ref 39.0–52.0)
Hemoglobin: 17.4 g/dL — ABNORMAL HIGH (ref 13.0–17.0)
MCH: 29.2 pg (ref 26.0–34.0)
MCHC: 33.4 g/dL (ref 30.0–36.0)
MCV: 87.4 fL (ref 80.0–100.0)
Platelets: 241 10*3/uL (ref 150–400)
RBC: 5.96 MIL/uL — ABNORMAL HIGH (ref 4.22–5.81)
RDW: 13.8 % (ref 11.5–15.5)
WBC: 7 10*3/uL (ref 4.0–10.5)
nRBC: 0 % (ref 0.0–0.2)

## 2020-10-28 LAB — BASIC METABOLIC PANEL
Anion gap: 11 (ref 5–15)
BUN: 11 mg/dL (ref 6–20)
CO2: 27 mmol/L (ref 22–32)
Calcium: 9.3 mg/dL (ref 8.9–10.3)
Chloride: 99 mmol/L (ref 98–111)
Creatinine, Ser: 1.53 mg/dL — ABNORMAL HIGH (ref 0.61–1.24)
GFR, Estimated: 53 mL/min — ABNORMAL LOW (ref >60)
Glucose, Bld: 158 mg/dL — ABNORMAL HIGH (ref 70–99)
Potassium: 4.1 mmol/L (ref 3.5–5.1)
Sodium: 137 mmol/L (ref 135–145)

## 2020-10-28 LAB — C-REACTIVE PROTEIN: CRP: 0.6 mg/dL (ref <1.0)

## 2020-10-28 LAB — SEDIMENTATION RATE: Sed Rate: 2 mm/hr (ref 0–16)

## 2020-10-28 MED ORDER — KETOROLAC TROMETHAMINE 15 MG/ML IJ SOLN
15.0000 mg | Freq: Once | INTRAMUSCULAR | Status: AC
  Administered 2020-10-28: 18:00:00 15 mg via INTRAVENOUS
  Filled 2020-10-28: qty 1

## 2020-10-28 MED ORDER — IOHEXOL 300 MG/ML  SOLN
75.0000 mL | Freq: Once | INTRAMUSCULAR | Status: AC | PRN
  Administered 2020-10-28: 75 mL via INTRAVENOUS

## 2020-10-28 MED ORDER — SODIUM CHLORIDE 0.9 % IV BOLUS
1000.0000 mL | Freq: Once | INTRAVENOUS | Status: AC
  Administered 2020-10-28: 18:00:00 1000 mL via INTRAVENOUS

## 2020-10-28 NOTE — Discharge Instructions (Addendum)
It is very important to follow-up with your oral surgeon, Dr. Hoyt Koch, for further evaluation of your jaw pain.  This could be a hardware issue versus an occult infection.  If at any point you develop fevers, vomiting, new or worsening swelling, or any other new/concerning symptoms then return to the ER for evaluation.  Otherwise be sure to call on Monday, 12/13.

## 2020-10-28 NOTE — ED Triage Notes (Signed)
Pt here from home for eval of pain in his mouth x 4-5 days. Had surgery to remove all of his teeth one month ago. Endorses lightheadedness and generalized weakness, tachy to 140 in triage.

## 2020-10-28 NOTE — ED Notes (Signed)
DC instructions reviewed with patient and patient verbalized understanding. VSS and nad.

## 2020-10-28 NOTE — ED Notes (Signed)
Patient transported to CT 

## 2020-10-28 NOTE — ED Provider Notes (Signed)
[facility] EMERGENCY DEPARTMENT Provider Note   CSN: [id number] Arrival date & time: 10/28/20  1239     History Chief Complaint  Patient presents with  . Dental Pain  . Tachycardia    John [name] is a 54 y.o. male.  HPI 54 year old male presents with left jaw/mouth pain.  Ongoing for about a week.  Feels like something has come loose near his gums.  No swelling, trouble breathing or swallowing.  No fevers but he has had some chills and sweats.  He is also been lightheaded during this time.  No chest pain or shortness of breath.  No vomiting.  Has had a small amount of diarrhea.  Sometimes the pain radiates to the point that he feels like it is going behind his right ear.  No headache.  Past Medical History:  Diagnosis Date  . Arthritis   . Bright's disease    history of at age 63  . Cellulitis   . GERD (gastroesophageal reflux disease)   . History of kidney stones   . Hypertension   . MRSA (methicillin resistant Staphylococcus aureus)   . Seizures ([place])    at age 30 when brights disease was discoved; 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 no Seizures since then and on no meds.  . Sleep apnea    uses CPAP    Patient Active Problem List   Diagnosis Date Noted  . OBESITY 03/29/2010  . CHEST PAIN 03/29/2010  . SLEEP APNEA 05/15/2009    Past Surgical History:  Procedure Laterality Date  . APPENDECTOMY    . CYSTOSCOPY WITH STENT PLACEMENT Left 08/28/2016   Procedure: CYSTOSCOPY WITH LEFT URETERAL STENT PLACEMENT;  Surgeon: Cleon Gustin, MD;  Location: [facility];  Service: Urology;  Laterality: Left;  . CYSTOSCOPY WITH STENT PLACEMENT Left 09/18/2016   Procedure: CYSTOSCOPY WITH STENT PLACEMENT;  Surgeon: Cleon Gustin, MD;  Location: [facility];  Service: Urology;  Laterality: Left;  . CYSTOSCOPY/RETROGRADE/URETEROSCOPY/STONE EXTRACTION WITH BASKET Left 08/28/2016   Procedure: CYSTOSCOPYLEFT RETROGRADE PYELOGRAM, DIAGNOSITIC LEFT URETEROSCOPY;   Surgeon: Cleon Gustin, MD;  Location: [facility];  Service: Urology;  Laterality: Left;  . CYSTOSCOPY/RETROGRADE/URETEROSCOPY/STONE EXTRACTION WITH BASKET Left 09/18/2016   Procedure: CYSTOSCOPY/RETROGRADE/URETEROSCOPY/STONE EXTRACTION WITH BASKET;  Surgeon: Cleon Gustin, MD;  Location: [facility];  Service: Urology;  Laterality: Left;  . HOLMIUM LASER APPLICATION Left 24/03/8098   Procedure: HOLMIUM LASER APPLICATION;  Surgeon: Cleon Gustin, MD;  Location: [facility];  Service: Urology;  Laterality: Left;  . KNEE ARTHROSCOPY Left    x2  . MOUTH SURGERY Left    jawx2' has plate and bone graft of jaw; graft was from hip.  Marland Kitchen SHOULDER SURGERY Left    trimmed cartiledge  . TOOTH EXTRACTION Bilateral 09/15/2020   Procedure: DENTAL RESTORATION/EXTRACTIONS;  Surgeon: Diona Browner, DDS;  Location: [facility];  Service: Oral Surgery;  Laterality: Bilateral;       Family History  Problem Relation Age of Onset  . Diabetes Mother   . Cancer Mother   . Cancer Father   . Cancer Other     Social History   Tobacco Use  . Smoking status: Never Smoker  . Smokeless tobacco: Never Used  Vaping Use  . Vaping Use: Never used  Substance Use Topics  . Alcohol use: Yes    Comment: occasionally  . Drug use: No    Home Medications Prior to Admission medications   Medication Sig Start Date End Date Taking? Authorizing Provider  aspirin  325 MG tablet Take 325 mg by mouth daily.      [provider]  cetirizine (ZYRTEC) 10 MG tablet Take 10 mg by mouth daily as needed for allergies.     [provider]  clindamycin (CLEOCIN) 300 MG capsule Take 1 capsule (300 mg total) by mouth 3 (three) times daily. 09/15/20   Diona Browner, DMD  clobetasol (TEMOVATE) 0.05 % cream Apply 1 application topically 2 (two) times daily as needed (for cellulitis).     [provider]  cyclobenzaprine (FLEXERIL) 10 MG tablet Take 10 mg by mouth 3 (three) times daily as needed for muscle spasms.      [provider]  diltiazem (CARDIZEM LA) 240 MG 24 hr tablet Take 240 mg by mouth daily.    [provider]  hydrochlorothiazide (HYDRODIURIL) 25 MG tablet Take 25 mg by mouth daily.    [provider]  ibuprofen (ADVIL) 200 MG tablet Take 600 mg by mouth every 8 (eight) hours as needed for moderate pain.    [provider]  metoprolol succinate (TOPROL-XL) 50 MG 24 hr tablet Take 50 mg by mouth daily. Take with or immediately following a meal.    [provider]  omeprazole (PRILOSEC) 40 MG capsule Take 40 mg by mouth daily as needed (for acid reflux).  12/20/13   [provider]  oxyCODONE (OXY IR/ROXICODONE) 5 MG immediate release tablet Take 1 tablet (5 mg total) by mouth every 6 (six) hours as needed. 09/15/20   Diona Browner, DMD  oxyCODONE-acetaminophen (PERCOCET/ROXICET) 5-325 MG tablet Take 1-2 tablets by mouth every 4 (four) hours as needed for severe pain. Patient taking differently: Take 1-2 tablets by mouth 2 (two) times daily as needed for severe pain.  09/18/16   McKenzie, Candee Furbish, MD  oxymetazoline (AFRIN) 0.05 % nasal spray Place 1 spray into both nostrils 2 (two) times daily as needed for congestion.    [provider]  vitamin B-12 (CYANOCOBALAMIN) 1000 MCG tablet Take 2,000 mcg by mouth 2 (two) times daily.    [provider]    Allergies    Penicillins  Review of Systems   Review of Systems  Constitutional: Positive for chills. Negative for fever.  HENT: Positive for dental problem.   Respiratory: Negative for shortness of breath.   Cardiovascular: Negative for chest pain.  Gastrointestinal: Negative for abdominal pain and vomiting.  Neurological: Positive for weakness and light-headedness. Negative for headaches.  All other systems reviewed and are negative.   Physical Exam Updated Vital Signs BP (!) 131/91 (BP Location: Left Arm)   Pulse 74   Temp 98.4 F (36.9 C) (Oral)   Resp 12   SpO2  96%   Physical Exam Vitals and nursing note reviewed.  Constitutional:      General: He is not in acute distress.    Appearance: He is well-developed and well-nourished. He is obese. He is not ill-appearing or diaphoretic.  HENT:     Head: Normocephalic and atraumatic.     Right Ear: External ear normal.     Left Ear: External ear normal.     Nose: Nose normal.     Mouth/Throat:      Comments: Patient is edentulous. There appears to be a small tooth fragment as noted above. Otherwise no obvious swelling/abscess or ludwig angina. Eyes:     General:        Right eye: No discharge.        Left eye: No discharge.  Extraocular Movements: Extraocular movements intact.     Pupils: Pupils are equal, round, and reactive to light.  Cardiovascular:     Rate and Rhythm: Normal rate and regular rhythm.     Heart sounds: Normal heart sounds.  Pulmonary:     Effort: Pulmonary effort is normal.     Breath sounds: Normal breath sounds.  Abdominal:     Palpations: Abdomen is soft.     Tenderness: There is no abdominal tenderness.  Musculoskeletal:        General: No edema.     Cervical back: Neck supple.  Skin:    General: Skin is warm and dry.  Neurological:     Mental Status: He is alert.     Comments: CN 3-12 grossly intact. 5/5 strength in all 4 extremities. Grossly normal sensation. Normal finger to nose.   Psychiatric:        Mood and Affect: Mood is not anxious.     ED Results / Procedures / Treatments   Labs (all labs ordered are listed, but only abnormal results are displayed) Labs Reviewed  CBC - Abnormal; Notable for the following components:      Result Value   RBC 5.96 (*)    Hemoglobin 17.4 (*)    HCT 52.1 (*)    All other components within normal limits  BASIC METABOLIC PANEL - Abnormal; Notable for the following components:   Glucose, Bld 158 (*)    Creatinine, Ser 1.53 (*)    GFR, Estimated 53 (*)    All other components within normal limits  CULTURE, BLOOD  (ROUTINE X 2)  CULTURE, BLOOD (ROUTINE X 2)  SEDIMENTATION RATE  C-REACTIVE PROTEIN    EKG EKG Interpretation  Date/Time:  Saturday October 28 2020 13:12:52 EST Ventricular Rate:  121 PR Interval:  144 QRS Duration: 98 QT Interval:  328 QTC Calculation: 465 R Axis:   -113 Text Interpretation: Sinus tachycardia Right superior axis deviation Incomplete right bundle branch block Right ventricular hypertrophy Abnormal ECG rate is faster compared to Oct 2021 Confirmed by Sherwood Gambler [phone]) on 10/28/2020 5:58:00 PM   Radiology CT Soft Tissue Neck W Contrast  Result Date: 10/28/2020 CLINICAL DATA:  54 year old male with mouth pain for 4-5 days. Tooth extractions 1 month ago. Weakness, tachycardia. EXAM: CT NECK WITH CONTRAST TECHNIQUE: Multidetector CT imaging of the neck was performed using the standard protocol following the bolus administration of intravenous contrast. CONTRAST:  72m OMNIPAQUE IOHEXOL 300 MG/ML  SOLN COMPARISON:  None. FINDINGS: Pharynx and larynx: Mild streak artifact from mandible ORIF hardware at the level of the larynx. No laryngeal abnormality identified. Pharyngeal soft tissue contours remain within normal limits. Negative parapharyngeal and retropharyngeal spaces. Salivary glands: Sublingual space remains within normal limits. Submandibular glands are symmetric and within normal limits. Parotid glands are within normal limits. Thyroid: Negative. Lymph nodes: Asymmetric left level 1 B lymph node enlargement, with nodes up to 9 mm short axis. No regional fluid collection identified. See mandible details below. Left level 2 lymph nodes are also asymmetrically larger up to 12 mm short axis. No cystic or necrotic nodes identified. Other lymph node stations appear more normal. Vascular: Major vascular structures in the neck and at the skull base appear to be patent, including the left IJ. No atherosclerosis identified in the neck. Limited intracranial: Negative. Visualized  orbits: Negative. Mastoids and visualized paranasal sinuses: Clear bilaterally. Skeleton: Absent dentition, with multiple bilateral maxilla and mandible tooth extraction sites visible (e.g. Left mandible molar region  sagittal image 71). Superimposed malleable plate and screw or pin fixation hardware of the mandible from the left posterior body across the symphysis and to the right body. No hardware loosening identified. No fracture or suspicious mandible osteolysis identified. Bilateral TMJ appear symmetric. Likewise, no suspicious maxilla bony findings. Cervical spine degeneration. No acute osseous abnormality identified elsewhere. Upper chest: Negative. Normal visible superior mediastinum aside from lipomatosis. IMPRESSION: 1. Absent dentition with evidence of recent bilateral maxilla and mandible tooth extractions, and prior mandible ORIF eccentric to the right. There are reactive appearing left level 1 and level 2 lymph nodes. But no acute or suspicious osseous lesion, and no abscess or obvious cellulitis identified. Early (CT occult) Osteomyelitis is a consideration. And if there are other signs/symptoms of infection then blood cultures may be valuable. However, ORIF hardware artifact would probably significantly degrade MRI. Repeat Neck or Face CT may be most valuable if the patient does not improve. 2. Otherwise negative Neck CT. Electronically Signed   By: Genevie Ann M.D.   On: 10/28/2020 20:22    Procedures Procedures (including critical care time)  Medications Ordered in ED Medications  ketorolac (TORADOL) 15 MG/ML injection 15 mg (15 mg Intravenous Given 10/28/20 1823)  sodium chloride 0.9 % bolus 1,000 mL (0 mLs Intravenous Stopped 10/28/20 2034)  iohexol (OMNIPAQUE) 300 MG/ML solution 75 mL (75 mLs Intravenous Contrast Given 10/28/20 1948)    ED Course  I have reviewed the triage vital signs and the nursing notes.  Pertinent labs & imaging results that were available during my care of the  patient were reviewed by me and considered in my medical decision making (see chart for details).    MDM Rules/Calculators/A&P                          Workup shows benign blood work. Given his pain at gums/jaw, CT obtained. No obvious abscess/cellulitis but does have some lymphadenopathy. Radiology brings up concern of occult osteomyelitis. Patient is well appearing. I discussed with ID on call, Dr. Gale Journey, who recommends blood cultures, ESR, CRP, and follow up with his surgeon for further workup depending on his course. No need for admission or antibiotics at this time. D/c home with return precautions. Final Clinical Impression(s) / ED Diagnoses Final diagnoses:  Jaw pain    Rx / DC Orders ED Discharge Orders    None       Sherwood Gambler, MD 10/29/20 1236

## 2020-11-03 LAB — CULTURE, BLOOD (ROUTINE X 2)
Culture: NO GROWTH
Culture: NO GROWTH
Special Requests: ADEQUATE
Special Requests: ADEQUATE

## 2020-11-06 ENCOUNTER — Other Ambulatory Visit: Payer: Self-pay | Admitting: Oral Surgery

## 2020-11-06 DIAGNOSIS — K1370 Unspecified lesions of oral mucosa: Secondary | ICD-10-CM

## 2020-12-03 ENCOUNTER — Other Ambulatory Visit: Payer: Medicare Other

## 2020-12-12 ENCOUNTER — Ambulatory Visit
Admission: RE | Admit: 2020-12-12 | Discharge: 2020-12-12 | Disposition: A | Discharge status: Home / Self Care | Payer: Medicare Other | Source: Ambulatory Visit | Attending: Oral Surgery | Admitting: Oral Surgery

## 2020-12-12 ENCOUNTER — Other Ambulatory Visit: Payer: Self-pay

## 2020-12-12 DIAGNOSIS — K1370 Unspecified lesions of oral mucosa: Secondary | ICD-10-CM

## 2020-12-12 MED ORDER — GADOBENATE DIMEGLUMINE 529 MG/ML IV SOLN
20.0000 mL | Freq: Once | INTRAVENOUS | Status: AC | PRN
  Administered 2020-12-12: 20 mL via INTRAVENOUS

## 2020-12-14 ENCOUNTER — Other Ambulatory Visit: Payer: Medicare Other

## 2020-12-14 DIAGNOSIS — Z20822 Contact with and (suspected) exposure to covid-19: Secondary | ICD-10-CM

## 2020-12-15 LAB — NOVEL CORONAVIRUS, NAA: SARS-CoV-2, NAA: NOT DETECTED

## 2020-12-15 LAB — SARS-COV-2, NAA 2 DAY TAT

## 2022-01-02 ENCOUNTER — Encounter: Payer: Self-pay | Admitting: *Deleted

## 2022-01-04 ENCOUNTER — Other Ambulatory Visit (HOSPITAL_COMMUNITY): Payer: Self-pay | Admitting: Nurse Practitioner

## 2022-01-04 ENCOUNTER — Ambulatory Visit (HOSPITAL_COMMUNITY)
Admission: RE | Admit: 2022-01-04 | Discharge: 2022-01-04 | Disposition: A | Discharge status: Home / Self Care | Payer: Medicare Other | Source: Ambulatory Visit | Attending: Nurse Practitioner | Admitting: Nurse Practitioner

## 2022-01-04 ENCOUNTER — Other Ambulatory Visit: Payer: Self-pay

## 2022-01-04 DIAGNOSIS — M542 Cervicalgia: Secondary | ICD-10-CM | POA: Diagnosis present

## 2022-01-04 DIAGNOSIS — M25511 Pain in right shoulder: Secondary | ICD-10-CM | POA: Diagnosis present

## 2022-01-04 DIAGNOSIS — M25512 Pain in left shoulder: Secondary | ICD-10-CM | POA: Insufficient documentation

## 2022-02-12 ENCOUNTER — Other Ambulatory Visit (HOSPITAL_COMMUNITY): Payer: Self-pay | Admitting: Nurse Practitioner

## 2022-02-12 DIAGNOSIS — M542 Cervicalgia: Secondary | ICD-10-CM

## 2022-02-28 ENCOUNTER — Telehealth: Payer: Self-pay | Admitting: *Deleted

## 2022-02-28 ENCOUNTER — Ambulatory Visit: Payer: Self-pay

## 2022-02-28 NOTE — Telephone Encounter (Signed)
Tried to call pt x 2 times for 8:00 nurse visit by phone.  Had to leave a voice mail. ?

## 2022-03-11 ENCOUNTER — Ambulatory Visit (HOSPITAL_COMMUNITY): Payer: Medicare Other

## 2022-03-26 ENCOUNTER — Encounter (HOSPITAL_COMMUNITY): Payer: Self-pay

## 2022-03-26 ENCOUNTER — Ambulatory Visit (HOSPITAL_COMMUNITY)
Admission: RE | Admit: 2022-03-26 | Discharge: 2022-03-26 | Disposition: A | Discharge status: Home / Self Care | Payer: Medicare Other | Source: Ambulatory Visit | Attending: Nurse Practitioner | Admitting: Nurse Practitioner

## 2022-03-26 DIAGNOSIS — M542 Cervicalgia: Secondary | ICD-10-CM

## 2022-03-29 ENCOUNTER — Other Ambulatory Visit (HOSPITAL_COMMUNITY): Payer: Self-pay | Admitting: Nurse Practitioner

## 2022-03-29 DIAGNOSIS — R200 Anesthesia of skin: Secondary | ICD-10-CM

## 2022-03-29 DIAGNOSIS — M542 Cervicalgia: Secondary | ICD-10-CM

## 2022-04-24 ENCOUNTER — Encounter: Payer: Self-pay | Admitting: *Deleted

## 2022-04-29 ENCOUNTER — Other Ambulatory Visit: Payer: Self-pay | Admitting: Nurse Practitioner

## 2022-05-07 ENCOUNTER — Ambulatory Visit: Payer: Medicare Other

## 2022-07-15 IMAGING — DX DG CERVICAL SPINE COMPLETE 4+V
7 series · 7 of 7 positions shown · non-contrast
Comparison: None.

CLINICAL DATA: Neck and bilateral shoulder pain, no known injury,
initial encounter

EXAM:
CERVICAL SPINE - COMPLETE 4+ VIEW

[c-spine lat]
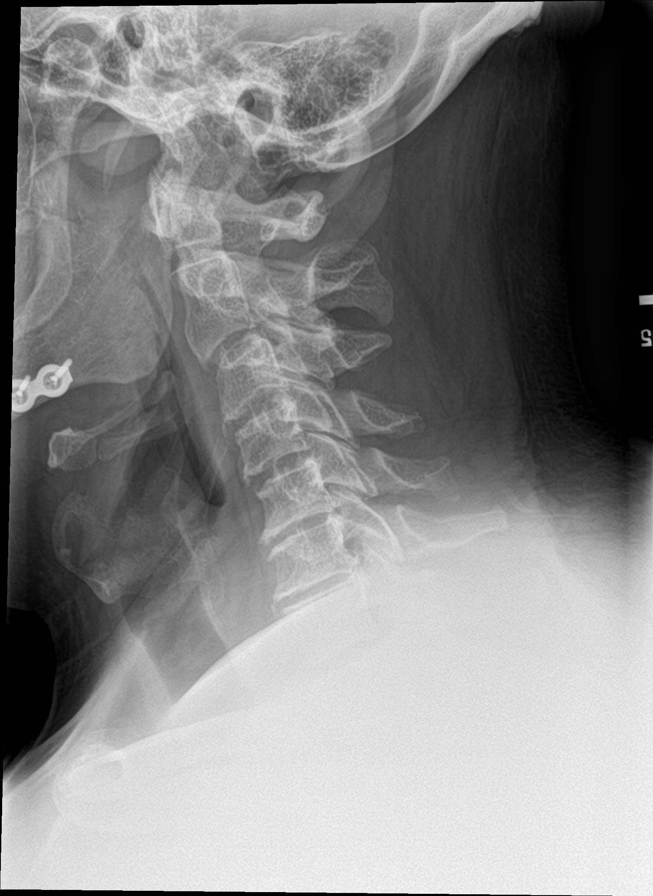

[c-spine obl (1 of 2)]
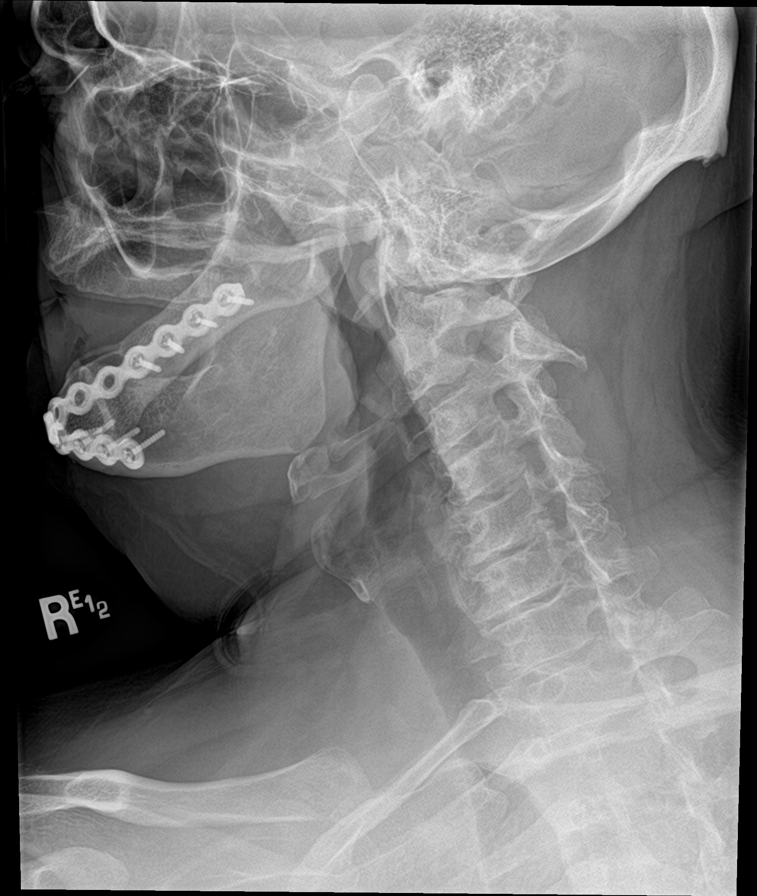

[c-spine obl (2 of 2)]
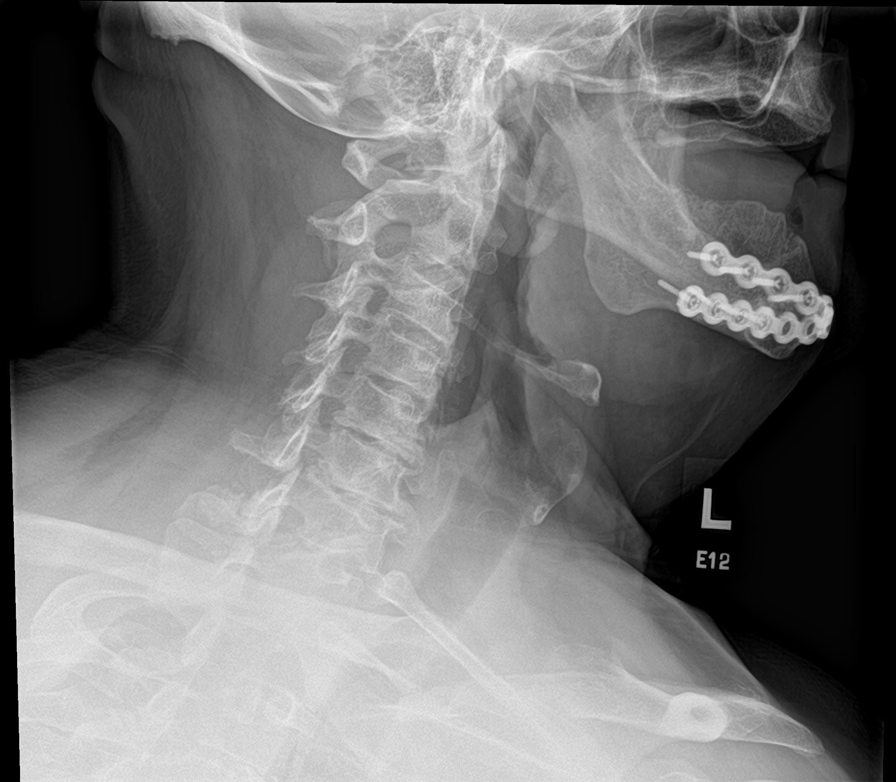

[c-spine ap]
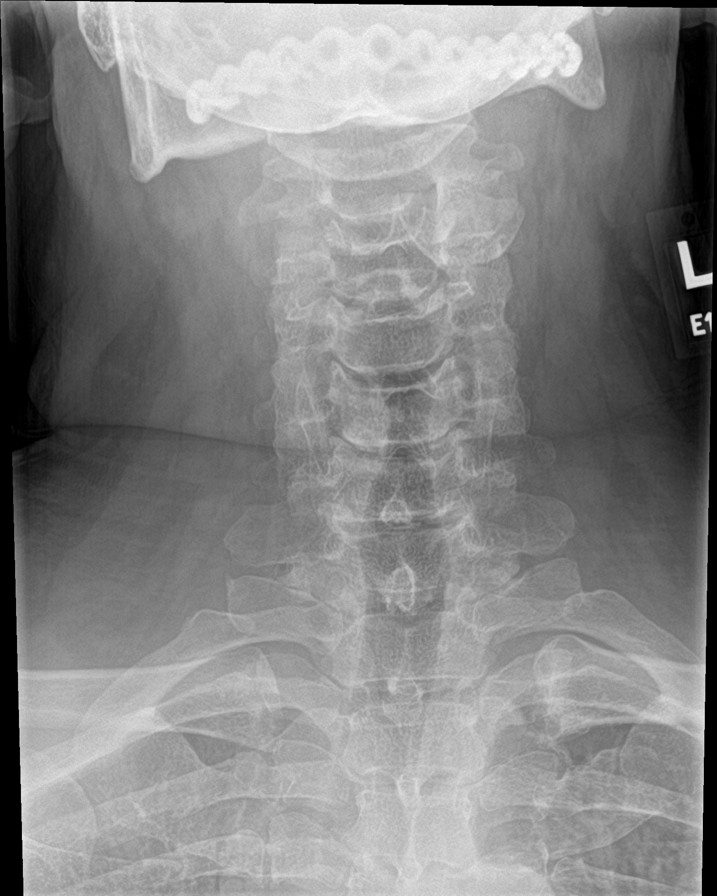

[c-spine open mouth (1 of 2)]
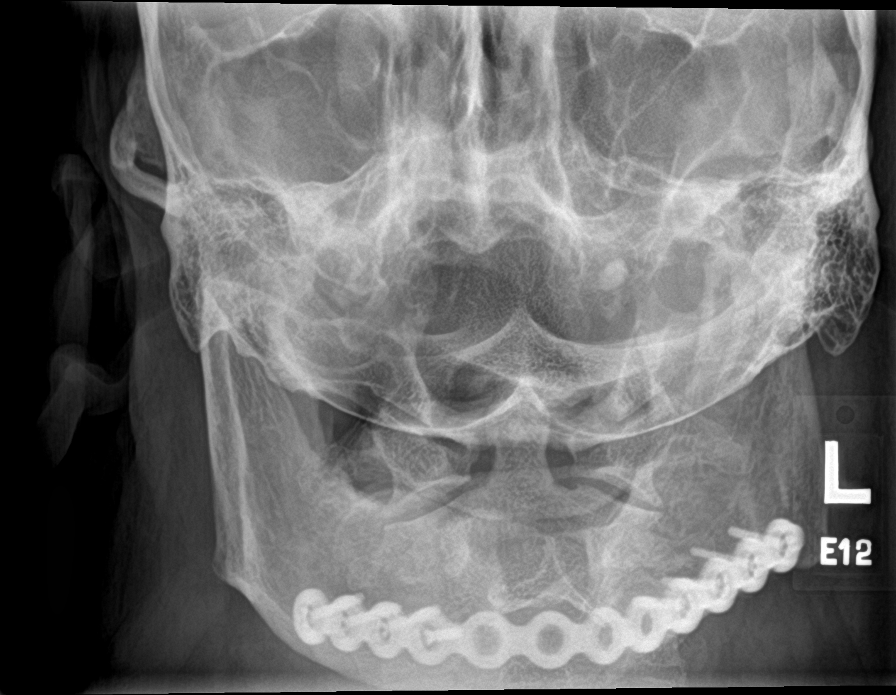

[c-spine swimmers trauma]
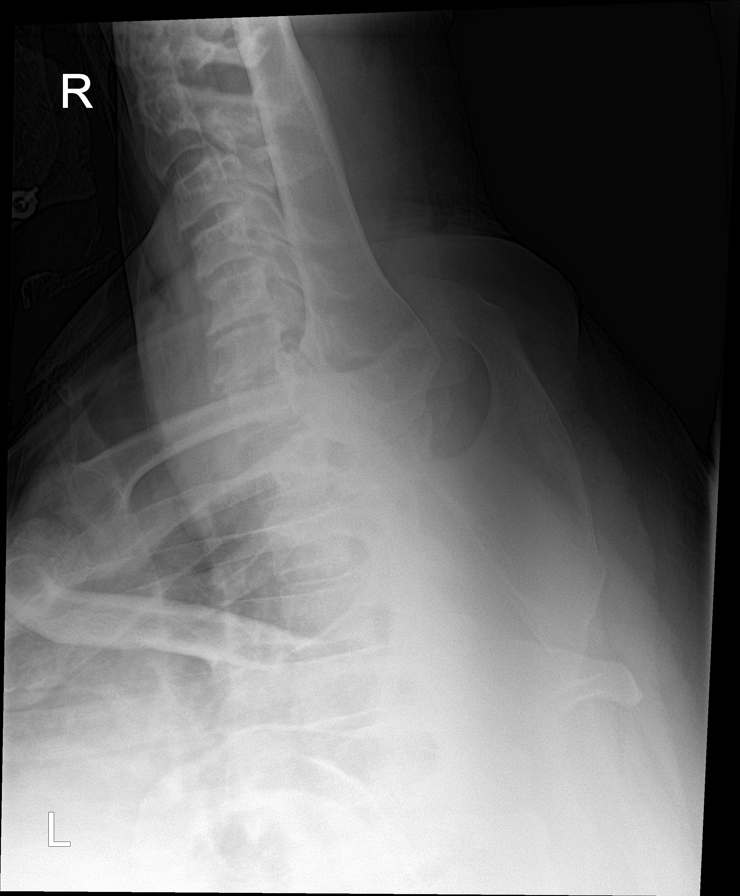

[c-spine open mouth (2 of 2)]
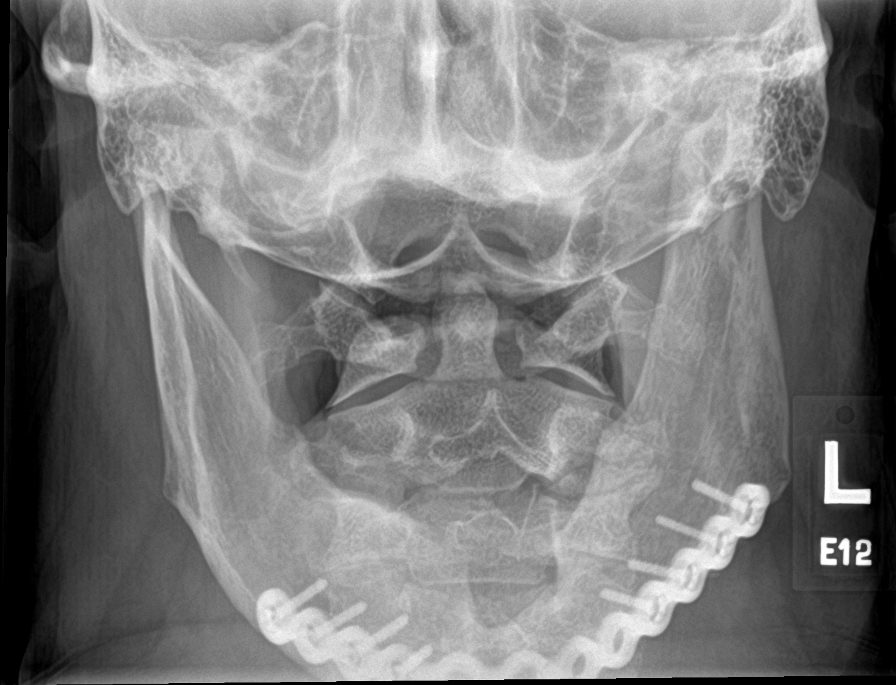

[7 of 7 positions shown; findings below may reference images not displayed]

FINDINGS: Seven cervical segments are well visualized. Vertebral body height
is well maintained. Multilevel disc space narrowing is noted from
C5-C7. Multilevel osteophytic changes are seen. The neural foramina
show mild narrowing bilaterally. No soft tissue abnormality is seen.
The odontoid is within normal limits. Surgical fixation involving
the anterior mandible is noted.
IMPRESSION: Degenerative changes of the cervical spine without acute
abnormality.

## 2022-07-15 IMAGING — DX DG SHOULDER 2+V*L*
3 series · 3 of 3 positions shown · non-contrast
Comparison: None.

CLINICAL DATA: Left shoulder pain, no known injury, initial
encounter

EXAM:
LEFT SHOULDER - 2+ VIEW

[shoulder grashey]
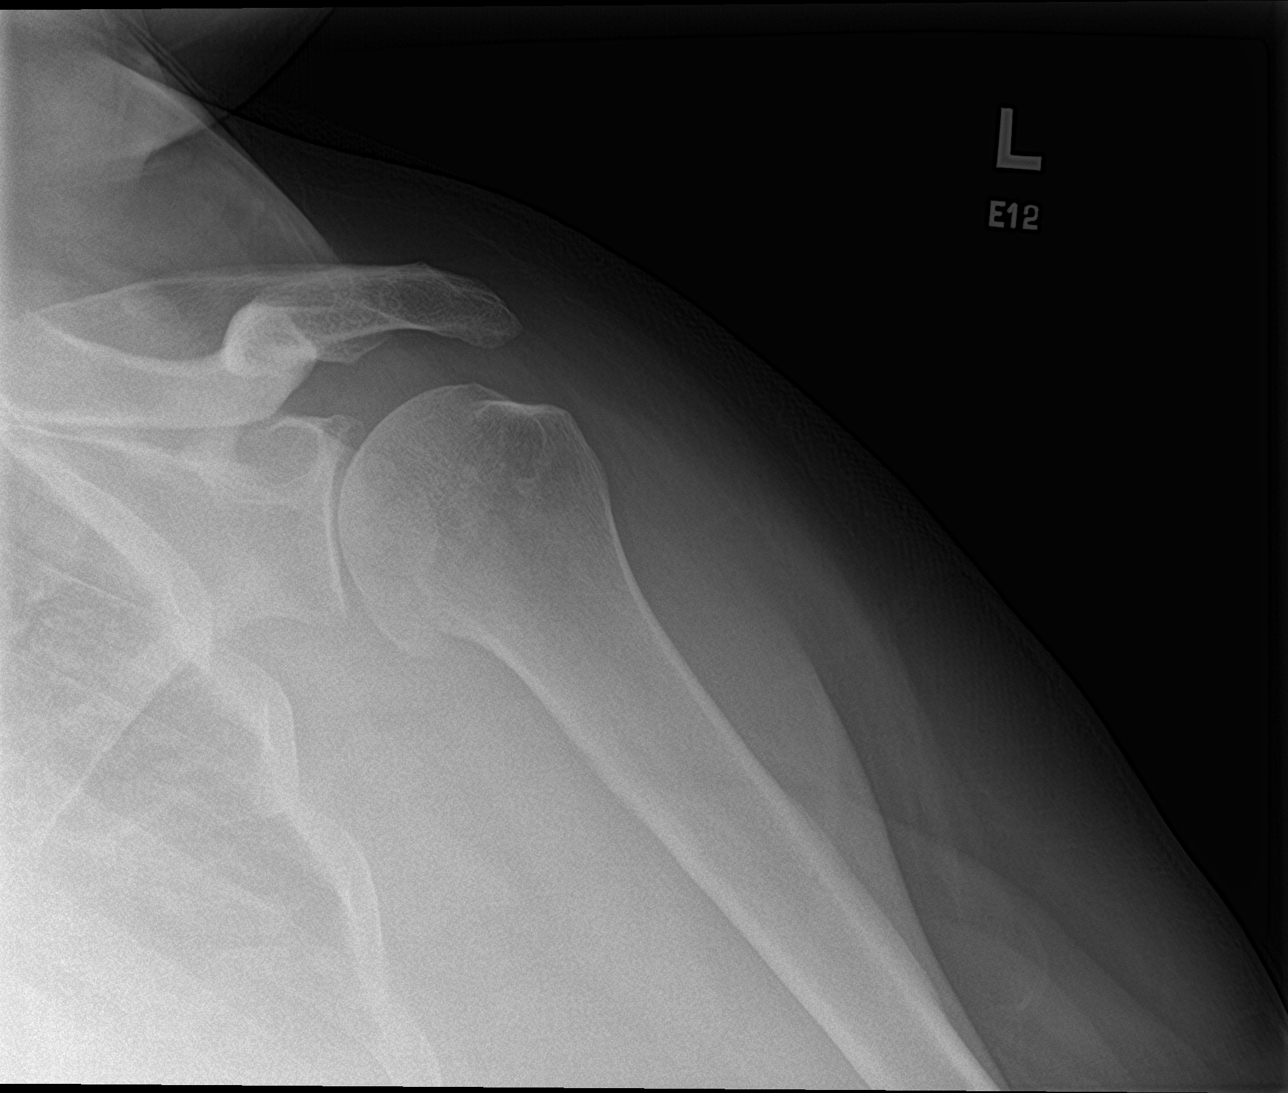

[shoulder y view]
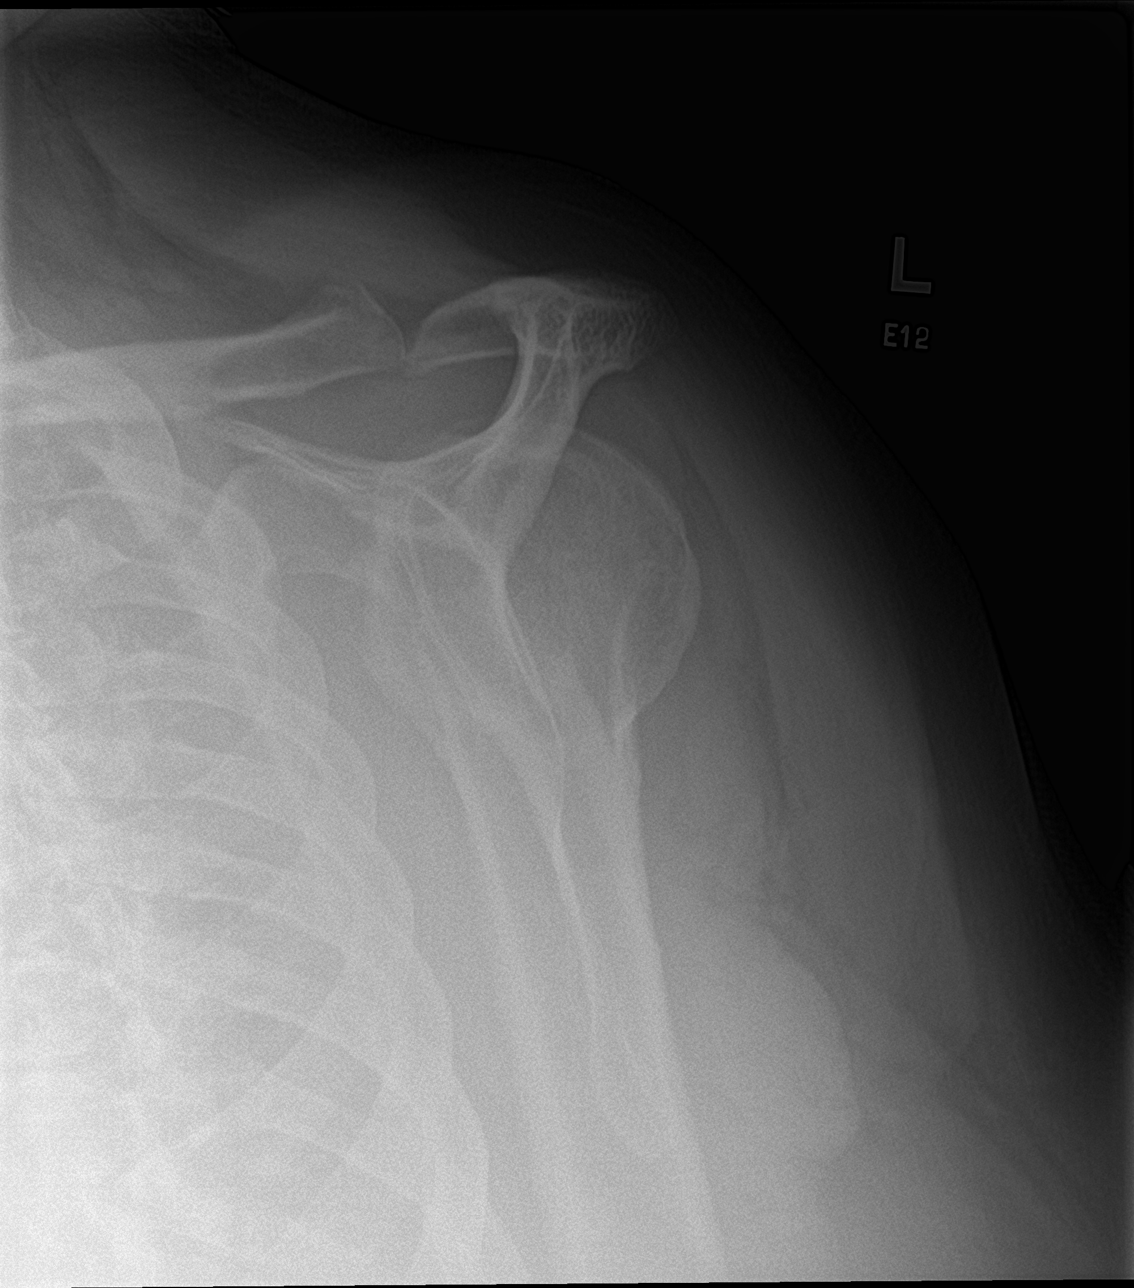

[shoulder axillary]
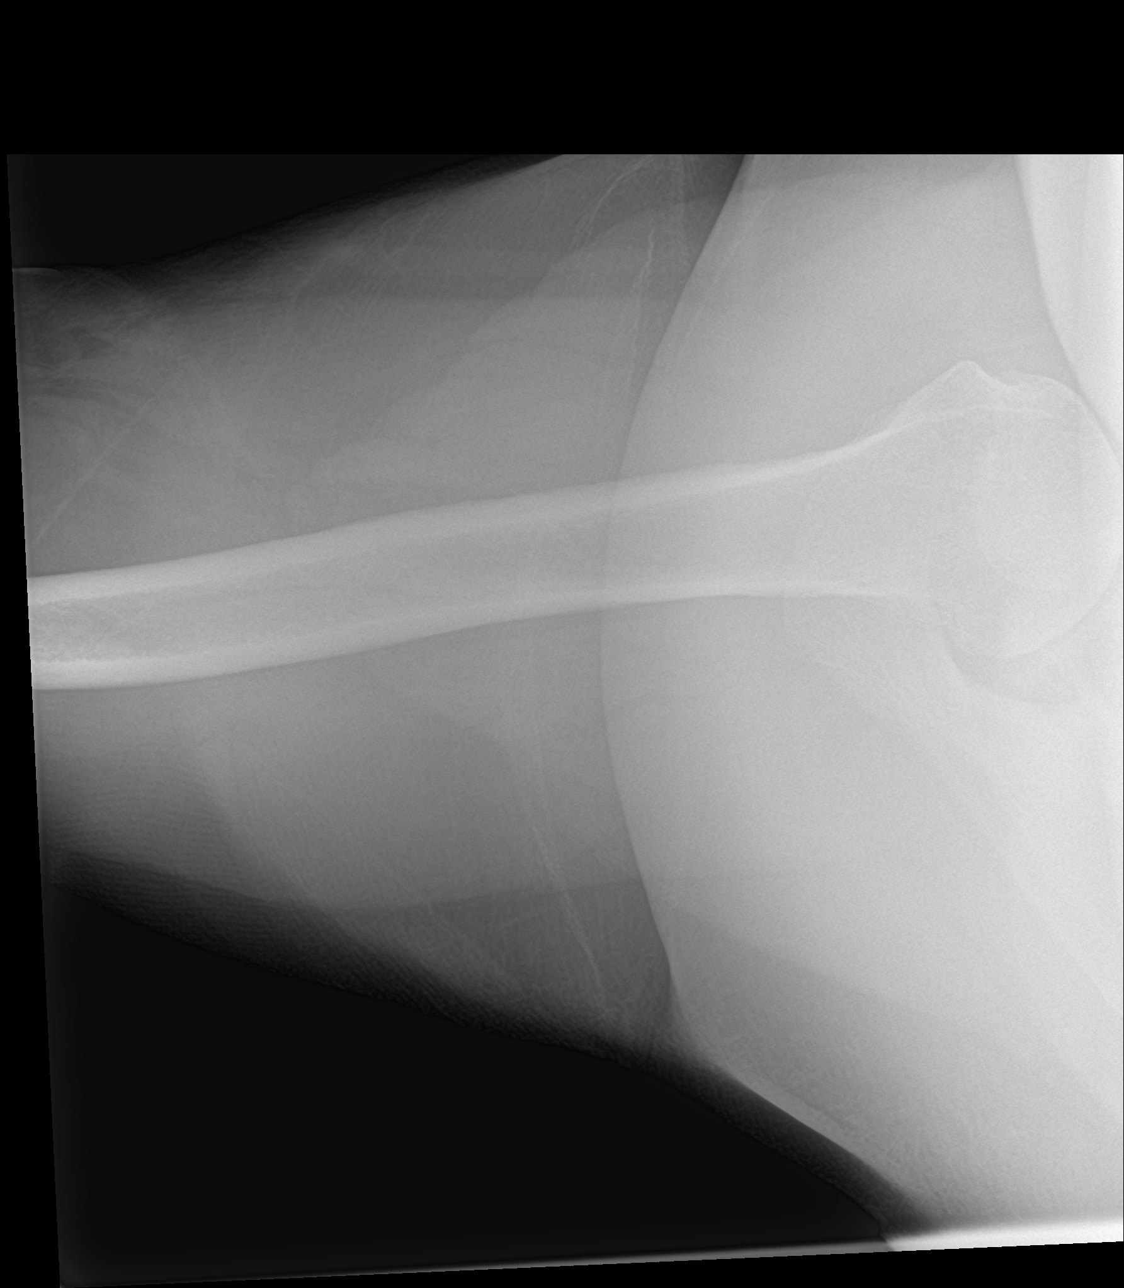

[3 of 3 positions shown; findings below may reference images not displayed]

FINDINGS: Mild degenerative changes of the glenohumeral joint are seen. No
acute fracture or dislocation is seen. No soft tissue abnormality is
noted.
IMPRESSION: Degenerative change without acute abnormality.

## 2022-08-30 ENCOUNTER — Ambulatory Visit: Payer: Self-pay | Admitting: Otolaryngology

## 2022-08-30 NOTE — H&P (Addendum)
HPI:   John [name] is a 56 y.o. male who presents as a new patient.  John [name] presents today with complaint of sinus problems. He states this is been bothering him for over 4 months. He has had at least two 10-day courses of antibiotic from his PCP. He is still experiencing predominantly right-sided facial pain and pressure and yellowish postnasal drainage and mucus when clearing his nose. He does not give a long-term history of chronic sinus problems over the years.  PMH/Meds/All/SocHx/FamHx/ROS:   Past Medical History:  Diagnosis Date   Acid reflux   Hypertension   Sleep apnea   History reviewed. No pertinent surgical history.  No family history of bleeding disorders, wound healing problems or difficulty with anesthesia.   Social History   Socioeconomic History   Marital status: Divorced  Spouse name: Not on file   Number of children: Not on file   Years of education: Not on file   Highest education level: Not on file  Occupational History   Not on file  Tobacco Use   Smoking status: Never   Smokeless tobacco: Never  Vaping Use   Vaping Use: Never used  Substance and Sexual Activity   Alcohol use: Yes  Comment: occasionally   Drug use: Never   Sexual activity: Not on file  Other Topics Concern   Not on file  Social History Narrative   Not on file   Social Determinants of Health   Financial Resource Strain: Not on file  Food Insecurity: Not on file  Transportation Needs: Not on file  Physical Activity: Not on file  Stress: Not on file  Social Connections: Not on file  Housing Stability: Not on file   Current Outpatient Medications:   aspirin 325 MG tablet *ANTIPLATELET*, Take 1 tablet (325 mg total) by mouth daily., Disp: , Rfl:   cetirizine (ZYRTEC) 10 MG tablet, Take 1 tablet (10 mg total) by mouth daily as needed., Disp: , Rfl:   clobetasoL (TEMOVATE) 0.05 % cream, APPLY A SMALL AMOUNT TO THE AFFECTED AREA TWICE DAILY, Disp: , Rfl:    cyanocobalamin (VITAMIN B-12) 1,000 mcg/mL injection, INJECT 1 ML IN THE MUSCLE EVERY 30 DAYS FOR VITAMIN B12 DEFICIENCY, Disp: , Rfl:   cyclobenzaprine (FLEXERIL) 10 MG tablet, TAKE 1 TABLET BY MOUTH UP TO THREE TIMES DAILY FOR MUSCLE SPASM, Disp: , Rfl:   dilTIAZem (MATZIM LA) 240 mg 24 hr tablet, Take 1 tablet (240 mg total) by mouth 2 times daily., Disp: , Rfl:   furosemide (LASIX) 40 MG tablet, TAKE 1 TABLET BY MOUTH DAILY AS NEEDED FOR FLUID RETENTION, Disp: , Rfl:   gabapentin (NEURONTIN) 100 MG capsule, , Disp: , Rfl:   gabapentin (NEURONTIN) 300 MG capsule, TAKE 1 CAPSULE BY MOUTH THREE TIMES DAILY FOR NERVE PAIN, Disp: , Rfl:   hydroCHLOROthiazide (HYDRODIURIL) 25 MG tablet, Take 1 tablet (25 mg total) by mouth daily. for high blood pressure, Disp: , Rfl:   ibuprofen (ADVIL,MOTRIN) 200 MG tablet, Take 3 tablets (600 mg total) by mouth., Disp: , Rfl:   lisinopriL (PRINIVIL,ZESTRIL) 10 MG tablet, TAKE 1 TABLET BY MOUTH EACH DAY FOR HIGH BLOOD PRESSURE, Disp: , Rfl:   LORazepam (ATIVAN) 1 MG tablet, TAKE 1/2 TO 1 TABLET BY MOUTH TWICE DAILY FOR ANXIETY, Disp: , Rfl:   metoPROLOL succinate (TOPROL-XL) 50 MG 24 hr tablet, TAKE 1 TABLET BY MOUTH EVERY MORNING FOR HIGH BLOOD PRESSURE, Disp: , Rfl:   mupirocin (BACTROBAN) 2 % cream, APPLY TOPICALLY TO  THE AFFECTED AREA THREE TIMES DAILY FOR INFECTION, Disp: , Rfl:   mupirocin (BACTROBAN) 2 % ointment, APPLY TO RIGHT BIG TOE TWICE DAILY, Disp: , Rfl:   omeprazole (PRILOSEC) 40 MG capsule, TAKE 1 CAPSULE BY MOUTH DAILY FOR HEARTBURN, Disp: , Rfl:   oxyCODONE-acetaminophen (PERCOCET) 5-325 mg per tablet, TAKE 1 TABLET BY MOUTH UP TO TWICE DAILY FOR SEVERE PAIN, Disp: , Rfl:   oxymetazoline (AFRIN) 0.05 % nasal spray, Administer 1 spray into affected nostril., Disp: , Rfl:   phentermine (ADIPEX-P) 37.5 MG capsule, TAKE 1 CAPSULE BY MOUTH EACH DAY FOR APPETITE SUPPRESSION, Disp: , Rfl:   potassium chloride (KLOR-CON) 10 MEQ CR tablet, TAKE 1 TABLET BY  MOUTH EACH MONRING AS A POTASSIUM SUPPLEMENT, Disp: , Rfl:   torsemide (DEMADEX) 20 MG tablet, TAKE 1 TABLET BY MOUTH DAILY FOR FLUID RETENTION, Disp: , Rfl:   A complete ROS was performed with pertinent positives/negatives noted in the HPI. The remainder of the ROS are negative.   Physical Exam:   BP 102/77  Pulse 73  Temp 97.5 F (36.4 C)  Ht 1.803 m ('5\' 11"'$ )  Wt (!) 188.4 kg (415 lb 6.4 oz)  BMI 57.94 kg/m   Constitutional:  Patient appears well-nourished and well-developed. No acute distress.  Head/Face: Facial features are symmetric. Skull is normocephalic. Hair and scalp are normal. Normal temporal artery pulses. TMJ shows no joint deformity swelling or erythema.  Eyes: Pupils are equal, round and reactive to light. Conjunctiva and lids are normal. Normal extraocular mobility. Normal vision by patient report.  Ears:  Right: Pinna and external meatus normal, normal ear canal skin and caliber without excessive cerumen or drainage. Tympanic membranes intact without effusion or infection. Hearing normal. Left: Pinna and external meatus normal, normal ear canal skin and caliber without excessive cerumen or drainage. Tympanic membranes intact without effusion or infection. Hearing normal.  Nose/Sinus/Nasopharynx: Septum is normal. Normal nasal mucosa. Normal inferior turbinates. Normal middle and superior turbinates, sinus ostia patent without obstruction, mass or discharge. Nasopharynx patent.  Oral cavity/Oropharynx: Lips normal, edentulous, normal oral vestibule. Normal floor of mouth, tongue and oral mucosa, no mucosal lesions, ulcer or mass, normal tongue mobility.  Hard and soft palate normal with normal mobility. One plus tonsils, no erythema or exudate. Base of tongue, retromolar trigone and oral pharynx normal. Normal sensation, mobility and gag.  Neck: No cervical lymphadenopathy, mass or swelling. Salivary glands normal to palpation without swelling, erythema  or mass. Normal facial nerve function. Normal thyroid gland palpation.  Neurological: Alert and oriented to self, place and time. Normal reflexes and motor skills, balance and coordination.  Psychiatric: No unusual anxiety or evidence of depression. Appropriate affect.  Independent Review of Additional Tests or Records:  None.  Procedures:  Procedure Note -flexible nasal Endoscopy  Risks/benefits and possible complications of this procedure were discussed in detail and the patient understood and agreed to proceed.  The nose was sprayed with oxymetazoline and 4% lidocaine. With the patient in the upright position, the flexible endscope was inserted into the nasal passage bilaterlly. Where visible, nasal secretions and mucosal crusting were removed with suction. The overall appearance of the nasal cavity and paranasal sinuses were noted and the findings are described below.  Findings: Yellowish mucus emanating from right middle meatal region. A specimen is obtained for culture and sensitivity.  The patient tolerated the procedure without difficulty and was discharged in stable condition.  Camelia Eng. Wright, PA-C [facility]  Impression & Plans:   1)  sinusitis  Endoscopy with culture specimen-I will contact patient with results and appropriate therapy when report received. Start using saline spray daily Fluticasone nasal spray daily we will tentatively plan to recheck in 3 weeks

## 2022-08-30 NOTE — H&P (View-Only) (Signed)
HPI:   John [name] is a 56 y.o. male who presents as a new patient.  John [name] presents today with complaint of sinus problems. He states this is been bothering him for over 4 months. He has had at least two 10-day courses of antibiotic from his PCP. He is still experiencing predominantly right-sided facial pain and pressure and yellowish postnasal drainage and mucus when clearing his nose. He does not give a long-term history of chronic sinus problems over the years.  PMH/Meds/All/SocHx/FamHx/ROS:   Past Medical History:  Diagnosis Date   Acid reflux   Hypertension   Sleep apnea   History reviewed. No pertinent surgical history.  No family history of bleeding disorders, wound healing problems or difficulty with anesthesia.   Social History   Socioeconomic History   Marital status: Divorced  Spouse name: Not on file   Number of children: Not on file   Years of education: Not on file   Highest education level: Not on file  Occupational History   Not on file  Tobacco Use   Smoking status: Never   Smokeless tobacco: Never  Vaping Use   Vaping Use: Never used  Substance and Sexual Activity   Alcohol use: Yes  Comment: occasionally   Drug use: Never   Sexual activity: Not on file  Other Topics Concern   Not on file  Social History Narrative   Not on file   Social Determinants of Health   Financial Resource Strain: Not on file  Food Insecurity: Not on file  Transportation Needs: Not on file  Physical Activity: Not on file  Stress: Not on file  Social Connections: Not on file  Housing Stability: Not on file   Current Outpatient Medications:   aspirin 325 MG tablet *ANTIPLATELET*, Take 1 tablet (325 mg total) by mouth daily., Disp: , Rfl:   cetirizine (ZYRTEC) 10 MG tablet, Take 1 tablet (10 mg total) by mouth daily as needed., Disp: , Rfl:   clobetasoL (TEMOVATE) 0.05 % cream, APPLY A SMALL AMOUNT TO THE AFFECTED AREA TWICE DAILY, Disp: , Rfl:    cyanocobalamin (VITAMIN B-12) 1,000 mcg/mL injection, INJECT 1 ML IN THE MUSCLE EVERY 30 DAYS FOR VITAMIN B12 DEFICIENCY, Disp: , Rfl:   cyclobenzaprine (FLEXERIL) 10 MG tablet, TAKE 1 TABLET BY MOUTH UP TO THREE TIMES DAILY FOR MUSCLE SPASM, Disp: , Rfl:   dilTIAZem (MATZIM LA) 240 mg 24 hr tablet, Take 1 tablet (240 mg total) by mouth 2 times daily., Disp: , Rfl:   furosemide (LASIX) 40 MG tablet, TAKE 1 TABLET BY MOUTH DAILY AS NEEDED FOR FLUID RETENTION, Disp: , Rfl:   gabapentin (NEURONTIN) 100 MG capsule, , Disp: , Rfl:   gabapentin (NEURONTIN) 300 MG capsule, TAKE 1 CAPSULE BY MOUTH THREE TIMES DAILY FOR NERVE PAIN, Disp: , Rfl:   hydroCHLOROthiazide (HYDRODIURIL) 25 MG tablet, Take 1 tablet (25 mg total) by mouth daily. for high blood pressure, Disp: , Rfl:   ibuprofen (ADVIL,MOTRIN) 200 MG tablet, Take 3 tablets (600 mg total) by mouth., Disp: , Rfl:   lisinopriL (PRINIVIL,ZESTRIL) 10 MG tablet, TAKE 1 TABLET BY MOUTH EACH DAY FOR HIGH BLOOD PRESSURE, Disp: , Rfl:   LORazepam (ATIVAN) 1 MG tablet, TAKE 1/2 TO 1 TABLET BY MOUTH TWICE DAILY FOR ANXIETY, Disp: , Rfl:   metoPROLOL succinate (TOPROL-XL) 50 MG 24 hr tablet, TAKE 1 TABLET BY MOUTH EVERY MORNING FOR HIGH BLOOD PRESSURE, Disp: , Rfl:   mupirocin (BACTROBAN) 2 % cream, APPLY TOPICALLY TO  THE AFFECTED AREA THREE TIMES DAILY FOR INFECTION, Disp: , Rfl:   mupirocin (BACTROBAN) 2 % ointment, APPLY TO RIGHT BIG TOE TWICE DAILY, Disp: , Rfl:   omeprazole (PRILOSEC) 40 MG capsule, TAKE 1 CAPSULE BY MOUTH DAILY FOR HEARTBURN, Disp: , Rfl:   oxyCODONE-acetaminophen (PERCOCET) 5-325 mg per tablet, TAKE 1 TABLET BY MOUTH UP TO TWICE DAILY FOR SEVERE PAIN, Disp: , Rfl:   oxymetazoline (AFRIN) 0.05 % nasal spray, Administer 1 spray into affected nostril., Disp: , Rfl:   phentermine (ADIPEX-P) 37.5 MG capsule, TAKE 1 CAPSULE BY MOUTH EACH DAY FOR APPETITE SUPPRESSION, Disp: , Rfl:   potassium chloride (KLOR-CON) 10 MEQ CR tablet, TAKE 1 TABLET BY  MOUTH EACH MONRING AS A POTASSIUM SUPPLEMENT, Disp: , Rfl:   torsemide (DEMADEX) 20 MG tablet, TAKE 1 TABLET BY MOUTH DAILY FOR FLUID RETENTION, Disp: , Rfl:   A complete ROS was performed with pertinent positives/negatives noted in the HPI. The remainder of the ROS are negative.   Physical Exam:   BP 102/77  Pulse 73  Temp 97.5 F (36.4 C)  Ht 1.803 m ('5\' 11"'$ )  Wt (!) 188.4 kg (415 lb 6.4 oz)  BMI 57.94 kg/m   Constitutional:  Patient appears well-nourished and well-developed. No acute distress.  Head/Face: Facial features are symmetric. Skull is normocephalic. Hair and scalp are normal. Normal temporal artery pulses. TMJ shows no joint deformity swelling or erythema.  Eyes: Pupils are equal, round and reactive to light. Conjunctiva and lids are normal. Normal extraocular mobility. Normal vision by patient report.  Ears:  Right: Pinna and external meatus normal, normal ear canal skin and caliber without excessive cerumen or drainage. Tympanic membranes intact without effusion or infection. Hearing normal. Left: Pinna and external meatus normal, normal ear canal skin and caliber without excessive cerumen or drainage. Tympanic membranes intact without effusion or infection. Hearing normal.  Nose/Sinus/Nasopharynx: Septum is normal. Normal nasal mucosa. Normal inferior turbinates. Normal middle and superior turbinates, sinus ostia patent without obstruction, mass or discharge. Nasopharynx patent.  Oral cavity/Oropharynx: Lips normal, edentulous, normal oral vestibule. Normal floor of mouth, tongue and oral mucosa, no mucosal lesions, ulcer or mass, normal tongue mobility.  Hard and soft palate normal with normal mobility. One plus tonsils, no erythema or exudate. Base of tongue, retromolar trigone and oral pharynx normal. Normal sensation, mobility and gag.  Neck: No cervical lymphadenopathy, mass or swelling. Salivary glands normal to palpation without swelling, erythema  or mass. Normal facial nerve function. Normal thyroid gland palpation.  Neurological: Alert and oriented to self, place and time. Normal reflexes and motor skills, balance and coordination.  Psychiatric: No unusual anxiety or evidence of depression. Appropriate affect.  Independent Review of Additional Tests or Records:  None.  Procedures:  Procedure Note -flexible nasal Endoscopy  Risks/benefits and possible complications of this procedure were discussed in detail and the patient understood and agreed to proceed.  The nose was sprayed with oxymetazoline and 4% lidocaine. With the patient in the upright position, the flexible endscope was inserted into the nasal passage bilaterlly. Where visible, nasal secretions and mucosal crusting were removed with suction. The overall appearance of the nasal cavity and paranasal sinuses were noted and the findings are described below.  Findings: Yellowish mucus emanating from right middle meatal region. A specimen is obtained for culture and sensitivity.  The patient tolerated the procedure without difficulty and was discharged in stable condition.  Camelia Eng. Ripley, PA-C [facility]  Impression & Plans:   1)  sinusitis  Endoscopy with culture specimen-I will contact patient with results and appropriate therapy when report received. Start using saline spray daily Fluticasone nasal spray daily we will tentatively plan to recheck in 3 weeks

## 2022-08-30 NOTE — Pre-Procedure Instructions (Signed)
Surgical Instructions    Your procedure is scheduled on Wednesday, October 25th.  Report to [facility] Main Entrance "A" at 8:30 A.M., then check in with the Admitting office.  Call this number if you have problems the morning of surgery:  [phone]   If you have any questions prior to your surgery date call [phone]: Open Monday-Friday 8am-4pm    Remember:  Do not eat or drink after midnight the night before your surgery.    Take these medicines the morning of surgery with A SIP OF WATER  diltiazem (MATZIM LA)  gabapentin (NEURONTIN)  metoprolol succinate (TOPROL-XL)    Take these medications as needed: cetirizine (ZYRTEC)  cyclobenzaprine (FLEXERIL)  omeprazole (PRILOSEC)  oxyCODONE-acetaminophen (PERCOCET/ROXICET)  oxymetazoline (AFRIN) nasal spray  Follow your surgeon's instructions on when to stop Aspirin.  If no instructions were given by your surgeon then you will need to call the office to get those instructions.    As of today, STOP taking any Aleve, Naproxen, Ibuprofen, Motrin, Advil, Goody's, BC's, all herbal medications, fish oil, and all vitamins. This includes: meloxicam (MOBIC)                     Do NOT Smoke (Tobacco/Vaping) for 24 hours prior to your procedure.  If you use a CPAP at night, you may bring your mask/headgear for your overnight stay.   Contacts, glasses, piercing's, hearing aid's, dentures or partials may not be worn into surgery, please bring cases for these belongings.    For patients admitted to the hospital, discharge time will be determined by your treatment team.   Patients discharged the day of surgery will not be allowed to drive home, and someone needs to stay with them for 24 hours.  SURGICAL WAITING ROOM VISITATION Patients having surgery or a procedure may have no more than 2 support people in the waiting area - these visitors may rotate.   Children under the age of 7 must have an adult with them who is not the  patient. If the patient needs to stay at the hospital during part of their recovery, the visitor guidelines for inpatient rooms apply. Pre-op nurse will coordinate an appropriate time for 1 support person to accompany patient in pre-op.  This support person may not rotate.   Please refer to the [facility] website for the visitor guidelines for Inpatients (after your surgery is over and you are in a regular room).    Special instructions:   [place]- Preparing For Surgery  Before surgery, you can play an important role. Because skin is not sterile, your skin needs to be as free of germs as possible. You can reduce the number of germs on your skin by washing with CHG (chlorahexidine gluconate) Soap before surgery.  CHG is an antiseptic cleaner which kills germs and bonds with the skin to continue killing germs even after washing.    Oral Hygiene is also important to reduce your risk of infection.  Remember - BRUSH YOUR TEETH THE MORNING OF SURGERY WITH YOUR REGULAR TOOTHPASTE  Please do not use if you have an allergy to CHG or antibacterial soaps. If your skin becomes reddened/irritated stop using the CHG.  Do not shave (including legs and underarms) for at least 48 hours prior to first CHG shower. It is OK to shave your face.  Please follow these instructions carefully.   Shower the NIGHT BEFORE SURGERY and the MORNING OF SURGERY  If you chose to wash your hair, wash  your hair first as usual with your normal shampoo.  After you shampoo, rinse your hair and body thoroughly to remove the shampoo.  Use CHG Soap as you would any other liquid soap. You can apply CHG directly to the skin and wash gently with a scrungie or a clean washcloth.   Apply the CHG Soap to your body ONLY FROM THE NECK DOWN.  Do not use on open wounds or open sores. Avoid contact with your eyes, ears, mouth and genitals (private parts). Wash Face and genitals (private parts)  with your normal soap.   Wash thoroughly,  paying special attention to the area where your surgery will be performed.  Thoroughly rinse your body with warm water from the neck down.  DO NOT shower/wash with your normal soap after using and rinsing off the CHG Soap.  Pat yourself dry with a CLEAN TOWEL.  Wear CLEAN PAJAMAS to bed the night before surgery  Place CLEAN SHEETS on your bed the night before your surgery  DO NOT SLEEP WITH PETS.   Day of Surgery: Take a shower with CHG soap. Do not wear jewelry Do not wear lotions, powders, colognes, or deodorant. Men may shave face and neck. Do not bring valuables to the hospital.  [facility] is not responsible for any belongings or valuables. Wear Clean/Comfortable clothing the morning of surgery Remember to brush your teeth WITH YOUR REGULAR TOOTHPASTE.   Please read over the following fact sheets that you were given.    If you received a COVID test during your pre-op visit  it is requested that you wear a mask when out in public, stay away from anyone that may not be feeling well and notify your surgeon if you develop symptoms. If you have been in contact with anyone that has tested positive in the last 10 days please notify you surgeon.

## 2022-09-02 ENCOUNTER — Encounter (HOSPITAL_COMMUNITY)
Admission: RE | Admit: 2022-09-02 | Discharge: 2022-09-02 | Disposition: A | Discharge status: Home / Self Care | Payer: Medicare Other | Source: Ambulatory Visit | Attending: Otolaryngology | Admitting: Otolaryngology

## 2022-09-02 ENCOUNTER — Encounter (HOSPITAL_COMMUNITY): Payer: Self-pay

## 2022-09-02 ENCOUNTER — Other Ambulatory Visit: Payer: Self-pay

## 2022-09-02 VITALS — BP 112/78 | HR 79 | Temp 98.2°F | Resp 17 | Ht 71.0 in | Wt >= 6400 oz

## 2022-09-02 DIAGNOSIS — I10 Essential (primary) hypertension: Secondary | ICD-10-CM | POA: Diagnosis not present

## 2022-09-02 DIAGNOSIS — R9431 Abnormal electrocardiogram [ECG] [EKG]: Secondary | ICD-10-CM | POA: Diagnosis not present

## 2022-09-02 DIAGNOSIS — Z01818 Encounter for other preprocedural examination: Secondary | ICD-10-CM | POA: Diagnosis present

## 2022-09-02 HISTORY — DX: Venous insufficiency (chronic) (peripheral): I87.2

## 2022-09-02 LAB — CBC
HCT: 47.1 % (ref 39.0–52.0)
Hemoglobin: 15.2 g/dL (ref 13.0–17.0)
MCH: 29.3 pg (ref 26.0–34.0)
MCHC: 32.3 g/dL (ref 30.0–36.0)
MCV: 90.9 fL (ref 80.0–100.0)
Platelets: 237 10*3/uL (ref 150–400)
RBC: 5.18 MIL/uL (ref 4.22–5.81)
RDW: 15 % (ref 11.5–15.5)
WBC: 7.4 10*3/uL (ref 4.0–10.5)
nRBC: 0 % (ref 0.0–0.2)

## 2022-09-02 LAB — BASIC METABOLIC PANEL
Anion gap: 4 — ABNORMAL LOW (ref 5–15)
BUN: 17 mg/dL (ref 6–20)
CO2: 29 mmol/L (ref 22–32)
Calcium: 8.4 mg/dL — ABNORMAL LOW (ref 8.9–10.3)
Chloride: 103 mmol/L (ref 98–111)
Creatinine, Ser: 1.19 mg/dL (ref 0.61–1.24)
GFR, Estimated: >60 mL/min (ref >60)
Glucose, Bld: 122 mg/dL — ABNORMAL HIGH (ref 70–99)
Potassium: 5.3 mmol/L — ABNORMAL HIGH (ref 3.5–5.1)
Sodium: 136 mmol/L (ref 135–145)

## 2022-09-02 NOTE — Progress Notes (Signed)
PCP - Dr.Steve Karie Kirks Cardiologist - denies  PPM/ICD - denies Device Orders - n/a Rep Notified - n/a  Chest x-ray - n/a EKG - 09/02/2022 Stress Test - 2011 ECHO - 2010 Cardiac Cath - denies  Sleep Study - yes, OSA uses CPAP  CPAP - 10.2 setting per pt  DM- denies  Blood Thinner Instructions:denies Aspirin Instructions:Follow your surgeon's instructions on when to stop Aspirin.  If no instructions were given by your surgeon then you will need to call the office to get those instructions.    NPO after MD. COVID TEST- n/a   Anesthesia review: YES, hx OSA. SEE sx.   Patient denies shortness of breath, fever, cough and chest pain at PAT appointment   All instructions explained to the patient, with a verbal understanding of the material. Patient agrees to go over the instructions while at home for a better understanding. Patient also instructed to self quarantine after being tested for COVID-19. The opportunity to ask questions was provided.

## 2022-09-03 ENCOUNTER — Encounter (HOSPITAL_COMMUNITY): Payer: Self-pay

## 2022-09-03 NOTE — Anesthesia Preprocedure Evaluation (Addendum)
Anesthesia Evaluation  Patient identified by MRN, date of birth, ID band Patient awake    Reviewed: Allergy & Precautions, NPO status , Patient's Chart, lab work & pertinent test results, reviewed documented beta blocker date and time   History of Anesthesia Complications Negative for: history of anesthetic complications  Airway Mallampati: II  TM Distance: >3 FB Neck ROM: Full    Dental no notable dental hx.    Pulmonary sleep apnea and Continuous Positive Airway Pressure Ventilation ,    Pulmonary exam normal        Cardiovascular hypertension, Pt. on medications and Pt. on home beta blockers Normal cardiovascular exam+ dysrhythmias (2010) Atrial Fibrillation      Neuro/Psych    GI/Hepatic Neg liver ROS, GERD  Medicated and Controlled,  Endo/Other  Morbid obesity (BMI 58)  Renal/GU   negative genitourinary   Musculoskeletal  (+) Arthritis ,   Abdominal   Peds  Hematology negative hematology ROS (+)   Anesthesia Other Findings Chronic sinusitis  Reproductive/Obstetrics                          Anesthesia Physical Anesthesia Plan  ASA: 3  Anesthesia Plan: General   Post-op Pain Management: Tylenol PO (pre-op)*   Induction: Intravenous  PONV Risk Score and Plan: 2 and Treatment may vary due to age or medical condition, Midazolam, Ondansetron and Dexamethasone  Airway Management Planned: Oral ETT  Additional Equipment: None  Intra-op Plan:   Post-operative Plan: Extubation in OR  Informed Consent: I have reviewed the patients History and Physical, chart, labs and discussed the procedure including the risks, benefits and alternatives for the proposed anesthesia with the patient or authorized representative who has indicated his/her understanding and acceptance.     Dental advisory given  Plan Discussed with: CRNA  Anesthesia Plan Comments: (PAT note written 09/03/2022 by  Myra Gianotti, PA-C. )      Anesthesia Quick Evaluation

## 2022-09-03 NOTE — Progress Notes (Signed)
Anesthesia Chart Review:  Case: [id number] Date/Time: 09/11/22 1015   Procedure: FRONTAL ETHMOID AND MAXILLARY ENDOSCOPIC SINUS SURGERY (Right)   Anesthesia type: General   Pre-op diagnosis: Chronic right maxillary sinusitis;Chronic frontoethmoidal sinusitis   Location: [facility] OR ROOM 07 / [facility]   Surgeons: Izora Gala, MD       DISCUSSION: Patient is a 56 year old male scheduled for the above procedure.  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 BMI is consistent with morbid obesity (BMI 58.37, WT 189.8 KG, HT '5\' 11"'$ ). Brief/transient afib with RVR 12/28/08 in the setting of viral gastroenteritis, concerted spontaneously.  09/02/22 labs noted. K 5.3 (no mention of hemolysis). Cr 1.19., glucose 122. CBC normal.   09/02/22 EKG showed NSR, LAFB, inferior infarct (age undetermined). EKG is not significantly changed when compared to 09/15/20 tracing. 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 No known further afib since 2010. He is on diltiazem and Coreg.   Dr. Constance Holster scheduled surgery at [facility] given history and BMI. He notes patient has chronic LE edema due to venous insufficiency. He is on HCTZ and torsemide. He is compliant with CPAP (10.2 settings). Reviewed with anesthesiologist Oleta Mouse, MD including EKG which appeared overall stable. Anesthesia team to evaluate on the day of  surgery.   ADDENDUM 09/04/22 11:37 AM:  Received last office note from [place]. Seen on 08/30/22 by Alphonsa Overall, NP-C. She notes plans for surgery, and added ENT surgery was needed prior to having a future neck surgery. He has been on phentermine for weight loss (no longer on medication list). His renal function improved after hydration and holding ibuprofen.   VS: BP 112/78   Pulse 79   Temp 36.8 C (Oral)   Resp 17   Ht '5\' 11"'$  (1.803 m)   Wt (!) 189.8 kg   SpO2 96%   BMI 58.37 kg/m    PROVIDERS: Lemmie Evens, MD is PCP (phone [phone]). Last office note requested.  - He was recently referred to [facility] by orthopedic surgeon Melina Schools, MD.  - He is not routinely followed by cardiology, but had an echo and stress test ~ 2010-2011 and saw Jacqulyn Ducking, MD for brief afib with RVR in setting of viral gastroenteritis.    LABS: Preoperative labs noted. See DISCUSSION.  (all labs ordered are listed, but only abnormal results are displayed)  Labs Reviewed  BASIC METABOLIC PANEL - Abnormal; Notable for the following components:      Result Value   Potassium 5.3 (*)    Glucose, Bld 122 (*)    Calcium 8.4 (*)    Anion gap 4 (*)    All other components within normal limits  CBC    IMAGES: CT Paranasal Sinuses 08/12/22 ([facility]): IMPRESSION:  - Right-sided predominant mucosal edema with complete opacification  right frontal and maxillary sinus and asymmetric mucosal edema right  ethmoid sinus. Findings suspicious for obstructing mass lesion of  ostiomeatal complex such  as polyp. Recommend direct visualization of  the mucosa.  - Progressive mucosal edema left frontal sinus.    EKG: 09/02/22:  Normal sinus rhythm Left anterior fascicular block Inferior infarct , age undetermined Abnormal ECG When compared with ECG of 28-Oct-2020 13:12, HEART RATE has decreased Left anterior fasicular block is now  Present Criteria for Inferior infarct are now Present Confirmed by End, Harrell Gave [phone]) on 09/02/2022 9:03:14 PM - EKG appears stable when compared to 09/15/20 tracing. He has had LAFB on EKGs dating back to at least 01/17/10.   CV: Nuclear stress test 04/19/10: IMPRESSION:  Probably negative stress nuclear myocardial study with  interpretation is somewhat impaired by suboptimal image quality.  No stress induced EKG abnormalities were present.  The left  ventricle was mildly dilated with normal regional and global  systolic function.  Diaphragmatic attenuation was noted as well as  a minimal septal defect of  uncertain significance.  The  possibility of a small degree of ischemia or infarction in this  region should be considered.  All other myocardial segments are  normally perfused indicating a low risk study.  Other findings as  noted.   - Per 04/23/10 cardiology note by Dr. Jacqulyn Ducking, "Symptoms have apparently resolved.  Stress nuclear study was of suboptimal quality, but no definite evidence for ischemia or infarction was noted.  LV systolic function was normal.  No further evaluation or treatment is necessary at the present time."   Echo 01/06/09: SUMMARY  -  Overall left ventricular systolic function was normal. Left      ventricular ejection fraction was estimated to be 65 %. There      were no left ventricular regional wall motion abnormalities.      Left ventricular wall thickness was mildly increased.  -  The right ventricle was moderately dilated. Right ventricular      systolic function was mild to moderately reduced.  -  The right atrium was mildly dilated.  -  Technically limited study due to poor sound wave transmission. RV      appears dilate and mild to moderately hypokinetic. Consider      evaluation for pulmonary embolus/pulmonary hypertension.    Past Medical History:  Diagnosis Date   Ameloblastoma of mandible 2011   s/p resection, left mandiular  reconstruction with bone plate and left iliac crest bone graft   Arthritis    Bright's disease    history of at age 70   Cellulitis    bil legs, worse left leg per pt   Dysrhythmia 12/28/2008   brief episode afib with RVR 12/28/08 in setting of gastroenteritis, spontaneously converted to NSR   GERD (gastroesophageal reflux disease)    History of kidney stones    Hypertension    MRSA (methicillin resistant Staphylococcus aureus)    Seizures ([place])    at age 40 when brights disease was discoved; 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 no Seizures since then and on no meds.   Sleep apnea    uses CPAP   Venous insufficiency of lower extremity     Past Surgical History:  Procedure Laterality Date   APPENDECTOMY     CYSTOSCOPY WITH STENT PLACEMENT Left 08/28/2016   Procedure: CYSTOSCOPY WITH LEFT URETERAL STENT PLACEMENT;  Surgeon: Cleon Gustin, MD;  Location: [facility];  Service: Urology;  Laterality: Left;   CYSTOSCOPY WITH STENT PLACEMENT Left 09/18/2016   Procedure: CYSTOSCOPY WITH STENT PLACEMENT;  Surgeon: Cleon Gustin, MD;  Location: [facility];  Service: Urology;  Laterality: Left;   CYSTOSCOPY/RETROGRADE/URETEROSCOPY/STONE EXTRACTION WITH BASKET Left 08/28/2016   Procedure: CYSTOSCOPYLEFT RETROGRADE PYELOGRAM, DIAGNOSITIC LEFT URETEROSCOPY;  Surgeon: Cleon Gustin, MD;  Location: [facility];  Service: Urology;  Laterality: Left;   CYSTOSCOPY/RETROGRADE/URETEROSCOPY/STONE EXTRACTION WITH BASKET Left 09/18/2016   Procedure: CYSTOSCOPY/RETROGRADE/URETEROSCOPY/STONE EXTRACTION WITH BASKET;  Surgeon: Cleon Gustin, MD;  Location: [facility];  Service: Urology;  Laterality: Left;   HOLMIUM LASER APPLICATION Left 58/06/5026   Procedure: HOLMIUM LASER APPLICATION;  Surgeon: Cleon Gustin, MD;  Location: [facility];  Service: Urology;  Laterality: Left;   KNEE ARTHROSCOPY Left    x2   MOUTH SURGERY Left    jawx2' has plate and bone graft of jaw; graft was from hip.   SHOULDER SURGERY Left     trimmed cartiledge   TOOTH EXTRACTION Bilateral 09/15/2020   Procedure: DENTAL RESTORATION/EXTRACTIONS;  Surgeon: Diona Browner, DDS;  Location: [place];  Service: Oral Surgery;  Laterality: Bilateral;    MEDICATIONS:  aspirin EC 325 MG tablet   cetirizine (ZYRTEC) 10 MG tablet   clobetasol (TEMOVATE) 0.05 % cream   cyanocobalamin (VITAMIN B12) 1000 MCG/ML injection   cyclobenzaprine (FLEXERIL) 10 MG tablet   diltiazem (MATZIM LA) 240 MG 24 hr tablet   gabapentin (NEURONTIN) 100 MG capsule   gabapentin (NEURONTIN) 300 MG capsule   hydrochlorothiazide (HYDRODIURIL) 25 MG tablet   lisinopril (ZESTRIL) 10 MG tablet   meloxicam (MOBIC) 15 MG tablet   metoprolol succinate (TOPROL-XL) 50 MG 24 hr tablet   Misc Natural Products (GLUCOSAMINE CHOND COMPLEX/MSM PO)   mupirocin ointment (BACTROBAN) 2 %   omeprazole (PRILOSEC) 40 MG capsule   oxyCODONE-acetaminophen (PERCOCET/ROXICET) 5-325 MG tablet   oxymetazoline (AFRIN) 0.05 % nasal spray   potassium chloride (KLOR-CON) 10 MEQ tablet   sodium chloride (OCEAN) 0.65 % SOLN nasal spray   torsemide (DEMADEX) 20 MG tablet   No current facility-administered medications for this encounter.    Myra Gianotti, PA-C Surgical Short Stay/Anesthesiology [facility] Phone [phone] [facility] Phone [phone] 09/03/2022 5:40 PM

## 2022-09-11 ENCOUNTER — Encounter (HOSPITAL_COMMUNITY)
Admission: RE | Disposition: A | Discharge status: Home / Self Care | Payer: Self-pay | Source: Home / Self Care | Attending: Otolaryngology

## 2022-09-11 ENCOUNTER — Ambulatory Visit (HOSPITAL_BASED_OUTPATIENT_CLINIC_OR_DEPARTMENT_OTHER): Payer: Medicare Other | Admitting: Anesthesiology

## 2022-09-11 ENCOUNTER — Observation Stay (HOSPITAL_COMMUNITY)
Admission: RE | Admit: 2022-09-11 | Discharge: 2022-09-12 | Disposition: A | Discharge status: Home / Self Care | Payer: Medicare Other | Attending: Otolaryngology | Admitting: Otolaryngology

## 2022-09-11 ENCOUNTER — Other Ambulatory Visit: Payer: Self-pay

## 2022-09-11 ENCOUNTER — Encounter (HOSPITAL_COMMUNITY): Payer: Self-pay | Admitting: Otolaryngology

## 2022-09-11 ENCOUNTER — Ambulatory Visit (HOSPITAL_COMMUNITY): Payer: Medicare Other | Admitting: Vascular Surgery

## 2022-09-11 DIAGNOSIS — I10 Essential (primary) hypertension: Secondary | ICD-10-CM | POA: Diagnosis not present

## 2022-09-11 DIAGNOSIS — G4733 Obstructive sleep apnea (adult) (pediatric): Secondary | ICD-10-CM

## 2022-09-11 DIAGNOSIS — J322 Chronic ethmoidal sinusitis: Secondary | ICD-10-CM | POA: Insufficient documentation

## 2022-09-11 DIAGNOSIS — J321 Chronic frontal sinusitis: Secondary | ICD-10-CM | POA: Insufficient documentation

## 2022-09-11 DIAGNOSIS — Z9989 Dependence on other enabling machines and devices: Secondary | ICD-10-CM

## 2022-09-11 DIAGNOSIS — J324 Chronic pansinusitis: Secondary | ICD-10-CM | POA: Diagnosis present

## 2022-09-11 DIAGNOSIS — Z7982 Long term (current) use of aspirin: Secondary | ICD-10-CM | POA: Insufficient documentation

## 2022-09-11 DIAGNOSIS — J320 Chronic maxillary sinusitis: Secondary | ICD-10-CM | POA: Diagnosis not present

## 2022-09-11 DIAGNOSIS — Z79899 Other long term (current) drug therapy: Secondary | ICD-10-CM | POA: Insufficient documentation

## 2022-09-11 DIAGNOSIS — J328 Other chronic sinusitis: Secondary | ICD-10-CM

## 2022-09-11 HISTORY — PX: MAXILLARY ANTROSTOMY: SHX2003

## 2022-09-11 HISTORY — PX: POLYPECTOMY: SHX5525

## 2022-09-11 HISTORY — PX: NASAL SINUS SURGERY: SHX719

## 2022-09-11 SURGERY — SINUS SURGERY, ENDOSCOPIC
Anesthesia: General | Site: Nose | Laterality: Right

## 2022-09-11 MED ORDER — AMISULPRIDE (ANTIEMETIC) 5 MG/2ML IV SOLN: 10.0000 mg | Freq: Once | INTRAVENOUS | Status: DC | PRN

## 2022-09-11 MED ORDER — OXYMETAZOLINE HCL 0.05 % NA SOLN
NASAL | Status: AC
  Filled 2022-09-11: qty 30

## 2022-09-11 MED ORDER — PROPOFOL 10 MG/ML IV BOLUS
INTRAVENOUS | Status: AC
  Filled 2022-09-11: qty 20

## 2022-09-11 MED ORDER — ALBUTEROL SULFATE (2.5 MG/3ML) 0.083% IN NEBU
INHALATION_SOLUTION | RESPIRATORY_TRACT | Status: AC
  Filled 2022-09-11: qty 3

## 2022-09-11 MED ORDER — LISINOPRIL 10 MG PO TABS
10.0000 mg | ORAL_TABLET | Freq: Every day | ORAL | Status: DC
  Administered 2022-09-11: 10 mg via ORAL
  Filled 2022-09-11 (×2): qty 1

## 2022-09-11 MED ORDER — CYCLOBENZAPRINE HCL 10 MG PO TABS: 10.0000 mg | ORAL_TABLET | Freq: Three times a day (TID) | ORAL | Status: DC | PRN

## 2022-09-11 MED ORDER — MIDAZOLAM HCL 2 MG/2ML IJ SOLN
INTRAMUSCULAR | Status: AC
  Filled 2022-09-11: qty 2

## 2022-09-11 MED ORDER — LIDOCAINE HCL 1 % IJ SOLN
INTRAMUSCULAR | Status: AC
  Filled 2022-09-11: qty 20

## 2022-09-11 MED ORDER — CHLORHEXIDINE GLUCONATE 0.12 % MT SOLN
OROMUCOSAL | Status: AC
  Filled 2022-09-11: qty 15

## 2022-09-11 MED ORDER — CLINDAMYCIN PALMITATE HCL 75 MG/5ML PO SOLR
300.0000 mg | Freq: Three times a day (TID) | ORAL | Status: AC
  Administered 2022-09-11 – 2022-09-12 (×3): 300 mg via ORAL
  Filled 2022-09-11 (×4): qty 20

## 2022-09-11 MED ORDER — POTASSIUM CHLORIDE ER 10 MEQ PO TBCR
10.0000 meq | EXTENDED_RELEASE_TABLET | Freq: Every evening | ORAL | Status: DC
  Filled 2022-09-11: qty 1

## 2022-09-11 MED ORDER — GABAPENTIN 100 MG PO CAPS
100.0000 mg | ORAL_CAPSULE | Freq: Two times a day (BID) | ORAL | Status: DC
  Administered 2022-09-11 – 2022-09-12 (×2): 100 mg via ORAL
  Filled 2022-09-11 (×3): qty 1

## 2022-09-11 MED ORDER — STERILE WATER FOR IRRIGATION IR SOLN
Status: DC | PRN
  Administered 2022-09-11: 1000 mL

## 2022-09-11 MED ORDER — GABAPENTIN 300 MG PO CAPS
300.0000 mg | ORAL_CAPSULE | Freq: Every day | ORAL | Status: DC
  Administered 2022-09-11: 300 mg via ORAL
  Filled 2022-09-11: qty 1

## 2022-09-11 MED ORDER — ROCURONIUM BROMIDE 10 MG/ML (PF) SYRINGE
PREFILLED_SYRINGE | INTRAVENOUS | Status: DC | PRN
  Administered 2022-09-11: 70 mg via INTRAVENOUS

## 2022-09-11 MED ORDER — ONDANSETRON HCL 4 MG/2ML IJ SOLN: 4.0000 mg | INTRAMUSCULAR | Status: DC | PRN

## 2022-09-11 MED ORDER — SODIUM CHLORIDE 0.9 % IR SOLN
Status: DC | PRN
  Administered 2022-09-11 (×2): 1000 mL

## 2022-09-11 MED ORDER — ACETAMINOPHEN 500 MG PO TABS
1000.0000 mg | ORAL_TABLET | Freq: Once | ORAL | Status: AC
  Administered 2022-09-11: 1000 mg via ORAL

## 2022-09-11 MED ORDER — EPHEDRINE SULFATE-NACL 50-0.9 MG/10ML-% IV SOSY
PREFILLED_SYRINGE | INTRAVENOUS | Status: DC | PRN
  Administered 2022-09-11 (×2): 10 mg via INTRAVENOUS

## 2022-09-11 MED ORDER — OXYMETAZOLINE HCL 0.05 % NA SOLN
NASAL | Status: DC | PRN
  Administered 2022-09-11: 1

## 2022-09-11 MED ORDER — ONDANSETRON HCL 4 MG PO TABS: 4.0000 mg | ORAL_TABLET | ORAL | Status: DC | PRN

## 2022-09-11 MED ORDER — SALINE SPRAY 0.65 % NA SOLN
1.0000 | NASAL | Status: DC | PRN
  Filled 2022-09-11 (×2): qty 44

## 2022-09-11 MED ORDER — PROPOFOL 10 MG/ML IV BOLUS
INTRAVENOUS | Status: DC | PRN
  Administered 2022-09-11: 200 mg via INTRAVENOUS

## 2022-09-11 MED ORDER — OXYCODONE HCL 5 MG/5ML PO SOLN: 5.0000 mg | Freq: Once | ORAL | Status: DC | PRN

## 2022-09-11 MED ORDER — HYDROCHLOROTHIAZIDE 25 MG PO TABS
25.0000 mg | ORAL_TABLET | Freq: Every day | ORAL | Status: DC
  Administered 2022-09-11: 25 mg via ORAL
  Filled 2022-09-11 (×2): qty 1

## 2022-09-11 MED ORDER — 0.9 % SODIUM CHLORIDE (POUR BTL) OPTIME
TOPICAL | Status: DC | PRN
  Administered 2022-09-11: 1000 mL

## 2022-09-11 MED ORDER — CHLORHEXIDINE GLUCONATE 0.12 % MT SOLN
15.0000 mL | Freq: Once | OROMUCOSAL | Status: AC
  Administered 2022-09-11: 15 mL via OROMUCOSAL

## 2022-09-11 MED ORDER — ACETAMINOPHEN 500 MG PO TABS
ORAL_TABLET | ORAL | Status: AC
  Filled 2022-09-11: qty 2

## 2022-09-11 MED ORDER — ALBUTEROL SULFATE (2.5 MG/3ML) 0.083% IN NEBU
2.5000 mg | INHALATION_SOLUTION | Freq: Once | RESPIRATORY_TRACT | Status: AC
  Administered 2022-09-11: 2.5 mg via RESPIRATORY_TRACT

## 2022-09-11 MED ORDER — GLUCOSAMINE CHOND COMPLEX/MSM PO TABS: ORAL_TABLET | Freq: Every day | ORAL | Status: DC

## 2022-09-11 MED ORDER — LIDOCAINE 2% (20 MG/ML) 5 ML SYRINGE
INTRAMUSCULAR | Status: DC | PRN
  Administered 2022-09-11: 100 mg via INTRAVENOUS

## 2022-09-11 MED ORDER — SUGAMMADEX SODIUM 200 MG/2ML IV SOLN
INTRAVENOUS | Status: DC | PRN
  Administered 2022-09-11: 200 mg via INTRAVENOUS

## 2022-09-11 MED ORDER — PHENYLEPHRINE HCL-NACL 20-0.9 MG/250ML-% IV SOLN
INTRAVENOUS | Status: DC | PRN
  Administered 2022-09-11: 40 ug/min via INTRAVENOUS

## 2022-09-11 MED ORDER — FENTANYL CITRATE (PF) 250 MCG/5ML IJ SOLN
INTRAMUSCULAR | Status: AC
  Filled 2022-09-11: qty 5

## 2022-09-11 MED ORDER — PANTOPRAZOLE SODIUM 40 MG PO TBEC
40.0000 mg | DELAYED_RELEASE_TABLET | Freq: Every day | ORAL | Status: DC
  Administered 2022-09-11 – 2022-09-12 (×2): 40 mg via ORAL
  Filled 2022-09-11 (×2): qty 1

## 2022-09-11 MED ORDER — PHENYLEPHRINE 80 MCG/ML (10ML) SYRINGE FOR IV PUSH (FOR BLOOD PRESSURE SUPPORT)
PREFILLED_SYRINGE | INTRAVENOUS | Status: DC | PRN
  Administered 2022-09-11: 160 ug via INTRAVENOUS
  Administered 2022-09-11: 80 ug via INTRAVENOUS

## 2022-09-11 MED ORDER — DEXAMETHASONE SODIUM PHOSPHATE 10 MG/ML IJ SOLN
INTRAMUSCULAR | Status: DC | PRN
  Administered 2022-09-11: 10 mg via INTRAVENOUS

## 2022-09-11 MED ORDER — BACITRACIN ZINC 500 UNIT/GM EX OINT: TOPICAL_OINTMENT | CUTANEOUS | Status: DC | PRN

## 2022-09-11 MED ORDER — TORSEMIDE 20 MG PO TABS
20.0000 mg | ORAL_TABLET | Freq: Every day | ORAL | Status: DC
  Administered 2022-09-11 – 2022-09-12 (×2): 20 mg via ORAL
  Filled 2022-09-11 (×2): qty 1

## 2022-09-11 MED ORDER — OXYMETAZOLINE HCL 0.05 % NA SOLN
2.0000 | NASAL | Status: DC
  Administered 2022-09-11: 2 via NASAL
  Filled 2022-09-11: qty 30

## 2022-09-11 MED ORDER — ORAL CARE MOUTH RINSE: 15.0000 mL | Freq: Once | OROMUCOSAL | Status: AC

## 2022-09-11 MED ORDER — POTASSIUM CHLORIDE 2 MEQ/ML IV SOLN
INTRAVENOUS | Status: DC
  Filled 2022-09-11: qty 1000

## 2022-09-11 MED ORDER — METOPROLOL SUCCINATE ER 25 MG PO TB24
50.0000 mg | ORAL_TABLET | Freq: Every day | ORAL | Status: DC
  Filled 2022-09-11 (×2): qty 2

## 2022-09-11 MED ORDER — MUPIROCIN 2 % EX OINT: 1.0000 | TOPICAL_OINTMENT | Freq: Two times a day (BID) | CUTANEOUS | Status: DC | PRN

## 2022-09-11 MED ORDER — OXYCODONE-ACETAMINOPHEN 5-325 MG PO TABS
1.0000 | ORAL_TABLET | ORAL | Status: DC | PRN
  Administered 2022-09-11 (×2): 1 via ORAL
  Filled 2022-09-11 (×2): qty 1

## 2022-09-11 MED ORDER — LIDOCAINE-EPINEPHRINE 1 %-1:100000 IJ SOLN
INTRAMUSCULAR | Status: AC
  Filled 2022-09-11: qty 1

## 2022-09-11 MED ORDER — LACTATED RINGERS IV SOLN: INTRAVENOUS | Status: DC

## 2022-09-11 MED ORDER — LORATADINE 10 MG PO TABS
10.0000 mg | ORAL_TABLET | Freq: Every day | ORAL | Status: DC
  Administered 2022-09-11 – 2022-09-12 (×2): 10 mg via ORAL
  Filled 2022-09-11 (×2): qty 1

## 2022-09-11 MED ORDER — ONDANSETRON HCL 4 MG/2ML IJ SOLN
INTRAMUSCULAR | Status: DC | PRN
  Administered 2022-09-11: 4 mg via INTRAVENOUS

## 2022-09-11 MED ORDER — FENTANYL CITRATE (PF) 100 MCG/2ML IJ SOLN: 25.0000 ug | INTRAMUSCULAR | Status: DC | PRN

## 2022-09-11 MED ORDER — ASPIRIN 325 MG PO TBEC
325.0000 mg | DELAYED_RELEASE_TABLET | Freq: Every day | ORAL | Status: DC
  Administered 2022-09-12: 325 mg via ORAL
  Filled 2022-09-11 (×2): qty 1

## 2022-09-11 MED ORDER — SUCCINYLCHOLINE CHLORIDE 200 MG/10ML IV SOSY
PREFILLED_SYRINGE | INTRAVENOUS | Status: DC | PRN
  Administered 2022-09-11: 180 mg via INTRAVENOUS

## 2022-09-11 MED ORDER — BACITRACIN ZINC 500 UNIT/GM EX OINT
TOPICAL_OINTMENT | CUTANEOUS | Status: AC
  Filled 2022-09-11: qty 28.35

## 2022-09-11 MED ORDER — CLOBETASOL PROPIONATE 0.05 % EX CREA: 1.0000 | TOPICAL_CREAM | Freq: Two times a day (BID) | CUTANEOUS | Status: DC | PRN

## 2022-09-11 MED ORDER — MELOXICAM 7.5 MG PO TABS
15.0000 mg | ORAL_TABLET | Freq: Every day | ORAL | Status: DC
  Administered 2022-09-11 – 2022-09-12 (×2): 15 mg via ORAL
  Filled 2022-09-11 (×2): qty 2

## 2022-09-11 MED ORDER — DILTIAZEM HCL ER 240 MG PO TB24
240.0000 mg | ORAL_TABLET | Freq: Two times a day (BID) | ORAL | Status: DC
  Administered 2022-09-11: 240 mg via ORAL
  Filled 2022-09-11 (×7): qty 1

## 2022-09-11 MED ORDER — OXYCODONE HCL 5 MG PO TABS: 5.0000 mg | ORAL_TABLET | Freq: Once | ORAL | Status: DC | PRN

## 2022-09-11 MED ORDER — FENTANYL CITRATE (PF) 250 MCG/5ML IJ SOLN
INTRAMUSCULAR | Status: DC | PRN
  Administered 2022-09-11: 100 ug via INTRAVENOUS

## 2022-09-11 MED ORDER — MIDAZOLAM HCL 2 MG/2ML IJ SOLN
INTRAMUSCULAR | Status: DC | PRN
  Administered 2022-09-11: 2 mg via INTRAVENOUS

## 2022-09-11 MED ORDER — CYANOCOBALAMIN 1000 MCG/ML IJ SOLN
500.0000 ug | INTRAMUSCULAR | Status: DC
  Administered 2022-09-11: 500 ug via INTRAMUSCULAR
  Filled 2022-09-11: qty 0.5

## 2022-09-11 MED ORDER — LIDOCAINE-EPINEPHRINE 1 %-1:100000 IJ SOLN
INTRAMUSCULAR | Status: DC | PRN
  Administered 2022-09-11: 4 mL

## 2022-09-11 SURGICAL SUPPLY — 30 items
BLADE TRICUT ROTATE M4 4 5PK (BLADE) ×3 IMPLANT
CANISTER SUCT 3000ML PPV (MISCELLANEOUS) ×3 IMPLANT
CNTNR URN SCR LID CUP LEK RST (MISCELLANEOUS) IMPLANT
CONT SPEC 4OZ STRL OR WHT (MISCELLANEOUS) ×2
DRESSING NASAL KENNEDY 3.5X.9 (MISCELLANEOUS) IMPLANT
DRSG NASAL KENNEDY 3.5X.9 (MISCELLANEOUS)
DRSG NASOPORE 8CM (GAUZE/BANDAGES/DRESSINGS) IMPLANT
ELECT REM PT RETURN 9FT ADLT (ELECTROSURGICAL) ×2
ELECTRODE REM PT RTRN 9FT ADLT (ELECTROSURGICAL) IMPLANT
FILTER ARTHROSCOPY CONVERTOR (FILTER) IMPLANT
GLOVE ECLIPSE 7.5 STRL STRAW (GLOVE) ×3 IMPLANT
GOWN STRL REUS W/ TWL LRG LVL3 (GOWN DISPOSABLE) ×6 IMPLANT
GOWN STRL REUS W/TWL LRG LVL3 (GOWN DISPOSABLE) ×4
KIT BASIN OR (CUSTOM PROCEDURE TRAY) ×3 IMPLANT
KIT TURNOVER KIT B (KITS) ×3 IMPLANT
MARKER PEN SURG W/LABELS BLK (STERILIZATION PRODUCTS) IMPLANT
NDL 27GX1/2 REG BEVEL ECLIP (NEEDLE) IMPLANT
NDL PRECISIONGLIDE 27X1.5 (NEEDLE) ×3 IMPLANT
NEEDLE 27GX1/2 REG BEVEL ECLIP (NEEDLE) ×2 IMPLANT
NEEDLE PRECISIONGLIDE 27X1.5 (NEEDLE) ×2 IMPLANT
NS IRRIG 1000ML POUR BTL (IV SOLUTION) ×3 IMPLANT
PAD ARMBOARD 7.5X6 YLW CONV (MISCELLANEOUS) ×6 IMPLANT
PATTIES SURGICAL .5 X3 (DISPOSABLE) ×3 IMPLANT
SHEATH ENDOSCRUB 0 DEG (SHEATH) IMPLANT
SPECIMEN JAR SMALL (MISCELLANEOUS) ×3 IMPLANT
TOWEL GREEN STERILE FF (TOWEL DISPOSABLE) ×3 IMPLANT
TRAY ENT MC OR (CUSTOM PROCEDURE TRAY) ×3 IMPLANT
TUBE CONNECTING 12X1/4 (SUCTIONS) ×3 IMPLANT
TUBING EXTENTION W/L.L. (IV SETS) IMPLANT
WATER STERILE IRR 1000ML POUR (IV SOLUTION) ×3 IMPLANT

## 2022-09-11 NOTE — Anesthesia Procedure Notes (Signed)
Procedure Name: Intubation Date/Time: 09/11/2022 10:59 AM  Performed by: Babs Bertin, CRNAPre-anesthesia Checklist: Patient identified, Emergency Drugs available, Suction available and Patient being monitored Patient Re-evaluated:Patient Re-evaluated prior to induction Oxygen Delivery Method: Circle System Utilized Preoxygenation: Pre-oxygenation with 100% oxygen Induction Type: IV induction and Rapid sequence Laryngoscope Size: Mac and 4 Grade View: Grade I Tube type: Oral Tube size: 7.5 mm Number of attempts: 1 Airway Equipment and Method: Stylet and Oral airway Placement Confirmation: ETT inserted through vocal cords under direct vision, positive ETCO2 and breath sounds checked- equal and bilateral Secured at: 23 cm Tube secured with: Tape Dental Injury: Teeth and Oropharynx as per pre-operative assessment

## 2022-09-11 NOTE — Interval H&P Note (Signed)
History and Physical Interval Note:  09/11/2022 10:23 AM  John [name]  has presented today for surgery, with the diagnosis of Chronic right maxillary sinusitis;Chronic frontoethmoidal sinusitis.  The various methods of treatment have been discussed with the patient and family. After consideration of risks, benefits and other options for treatment, the patient has consented to  Procedure(s): FRONTAL ETHMOID AND MAXILLARY ENDOSCOPIC SINUS SURGERY (Right) as a surgical intervention.  The patient's history has been reviewed, patient examined, no change in status, stable for surgery.  I have reviewed the patient's chart and labs.  Questions were answered to the patient's satisfaction.     Izora Gala

## 2022-09-11 NOTE — Transfer of Care (Signed)
Immediate Anesthesia Transfer of Care Note  Patient: Janice Coffin Wall  Procedure(s) Performed: FRONTAL ETHMOID AND MAXILLARY ENDOSCOPIC SINUS SURGERY (Right: Nose) MAXILLARY ANTROSTOMY WITH REMOVAL OF TISSUE (Nose) RIGHT ENDOSCOPIC POLYPECTOMY (Right: Nose)  Patient Location: PACU  Anesthesia Type:General  Level of Consciousness: awake, alert , and oriented  Airway & Oxygen Therapy: Patient Spontanous Breathing and Patient connected to face mask oxygen  Post-op Assessment: Report given to RN and Post -op Vital signs reviewed and stable  Post vital signs: Reviewed and stable  Last Vitals:  Vitals Value Taken Time  BP 127/76 09/11/22 1256  Temp    Pulse 86 09/11/22 1300  Resp 34 09/11/22 1300  SpO2 90 % 09/11/22 1300  Vitals shown include unvalidated device data.  Last Pain:  Vitals:   09/11/22 0841  PainSc: 5       Patients Stated Pain Goal: 1 (38/93/73 4287)  Complications: No notable events documented.

## 2022-09-11 NOTE — Progress Notes (Signed)
PHARMACIST - PHYSICIAN ORDER COMMUNICATION  CONCERNING: P&T Medication Policy on Herbal Medications  DESCRIPTION:  This patient's order for:  Glucosamine-Chondroitin  has been noted.  This product(s) is classified as an "herbal" or natural product. Due to a lack of definitive safety studies or FDA approval, nonstandard manufacturing practices, plus the potential risk of unknown drug-drug interactions while on inpatient medications, the Pharmacy and Therapeutics Committee does not permit the use of "herbal" or natural products of this type within [place].   ACTION TAKEN: The pharmacy department is unable to verify this order at this time and your patient has been informed of this safety policy. Please reevaluate patient's clinical condition at discharge and address if the herbal or natural product(s) should be resumed at that time.   

## 2022-09-11 NOTE — Progress Notes (Signed)
Pt admitted to 6N27 from PACU, alert and oriented x4. Will obtain SCDs for pt.

## 2022-09-11 NOTE — Op Note (Signed)
OPERATIVE REPORT  DATE OF SURGERY: 09/11/2022  PATIENT:  John [name],  56 y.o. male  PRE-OPERATIVE DIAGNOSIS:  Chronic right maxillary sinusitis;Chronic frontoethmoidal sinusitis  POST-OPERATIVE DIAGNOSIS:  Chronic right maxillary sinusitis;Chronic frontoethmoidal sinusitis  PROCEDURE:  Procedure(s): FRONTAL ETHMOID AND MAXILLARY ENDOSCOPIC SINUS SURGERY MAXILLARY ANTROSTOMY WITH REMOVAL OF TISSUE   SURGEON:  Beckie Salts, MD  ASSISTANTS: None  ANESTHESIA:   General   EBL: 500 ml  DRAINS: None  LOCAL MEDICATIONS USED: 1% Xylocaine with epinephrine  SPECIMEN: Right nasal and sinus contents  COUNTS:  Correct  PROCEDURE DETAILS: The patient was taken to the operating room and placed on the operating table in the supine position. Following induction of general endotracheal anesthesia, the face was draped in standard fashion.  Oxymetazoline spray was used preoperatively in the nasal cavities.  Using a 0 degree nasal endoscope the right nasal cavity was inspected and local anesthetic was infiltrated into the superior and posterior attachments of the right middle turbinate.  There was purulent secretions draining through the infundibular region immediately.  1.  Right endoscopic total ethmoidectomy.  The base of the uncinate process was partially incised with a sickle knife.  The microdebrider was then used to initiate the ethmoid dissection starting with the bulla.  Dissection continued posteriorly exonerating and opening up all of the anterior and posterior ethmoid cells.  There were purulent secretions throughout the majority of the ethmoid complex.  There was inflamed and polypoid mucosa.  There were no defined polyps and there was no fungal debris present.  All of the tissues were hyperemic and there was significant bleeding during the procedure.  The middle turbinate was partially resected to facilitate exposure.  2.  Right endoscopic maxillary antrostomy with removal of  tissue.  Using a 30 degree endoscope and curved suction the maxillary antrum was entered.  It was also filled with purulent secretions and inflamed mucosa.  Suction was used to remove polypoid tissue from the maxillary antrum.  The microdebrider was used to enlarge the antrostomy posteriorly and inferiorly.  3.  Right endoscopic sphenoidotomy.  After the ethmoid dissection was completed the 0 degree scope was used and the microdebrider was used to open the sphenoid ostium widely.  There is polypoid tissue within the sinus that was cleared out using the microdebrider.  The posterior wall of the sphenoid was measured 9 cm from the nasal aperture.  4.  Right endoscopic frontal sinusotomy.  After the ethmoid dissection was completed a curved suction and 30 degree scope was used to inspect the frontal recess.  It very difficult to inspect this area due to the tight anatomy and limited space as well as the bleeding.  Attempts were made using the Endo scrub but even with this I was unable to do a complete frontal dissection.  Some polypoid material was cleared out of the frontal recess but I could not go any further.  I was able to pass a suction into the frontal sinus.  No further attempts were made.  The right ethmoid cavity was packed with half of a NasalPore dressing.  Pharynx was suctioned blood and secretions.  Patient was awakened extubated and transferred to recovery in stable condition.    PATIENT DISPOSITION:  To PACU, stable

## 2022-09-12 ENCOUNTER — Encounter (HOSPITAL_COMMUNITY): Payer: Self-pay | Admitting: Otolaryngology

## 2022-09-12 DIAGNOSIS — J320 Chronic maxillary sinusitis: Secondary | ICD-10-CM | POA: Diagnosis not present

## 2022-09-12 LAB — SURGICAL PATHOLOGY

## 2022-09-12 MED ORDER — DILTIAZEM HCL ER COATED BEADS 240 MG PO CP24
240.0000 mg | ORAL_CAPSULE | Freq: Two times a day (BID) | ORAL | Status: DC
  Administered 2022-09-12: 240 mg via ORAL
  Filled 2022-09-12: qty 1

## 2022-09-12 MED ORDER — CLINDAMYCIN HCL 300 MG PO CAPS: 300.0000 mg | ORAL_CAPSULE | Freq: Three times a day (TID) | ORAL | 0 refills | Status: DC

## 2022-09-12 NOTE — Progress Notes (Signed)
   09/12/22 1119  AVS Discharge Documentation  AVS Discharge Instructions Including Medications Provided to patient/caregiver  Name of Person Receiving AVS Discharge Instructions Including Medications [place]  Name of Clinician That Reviewed AVS Discharge Instructions Including Medications Kelli Hope, RN   Discharged to home, patient has already set up a follow up appointment, prescriptions were sent to the patient's pharmacy. His family was at the bedside at the time AVS was reviewed. All questions answered. Family provided transportation.

## 2022-09-12 NOTE — Anesthesia Postprocedure Evaluation (Signed)
Anesthesia Post Note  Patient: John [name]  Procedure(s) Performed: FRONTAL ETHMOID AND MAXILLARY ENDOSCOPIC SINUS SURGERY (Right: Nose) MAXILLARY ANTROSTOMY WITH REMOVAL OF TISSUE (Nose) RIGHT ENDOSCOPIC POLYPECTOMY (Right: Nose)     Patient location during evaluation: PACU Anesthesia Type: General Level of consciousness: awake and alert Pain management: pain level controlled Vital Signs Assessment: post-procedure vital signs reviewed and stable Respiratory status: spontaneous breathing, nonlabored ventilation, respiratory function stable and patient connected to nasal cannula oxygen Cardiovascular status: blood pressure returned to baseline and stable Postop Assessment: no apparent nausea or vomiting Anesthetic complications: no   No notable events documented.  Last Vitals:  Vitals:   09/12/22 0739 09/12/22 0920  BP: (!) 100/57 (!) 91/43  Pulse: 72 73  Resp: 18   Temp:    SpO2: 96%     Last Pain:  Vitals:   09/12/22 0929  TempSrc:   PainSc: 0-No pain                 Tiajuana Amass

## 2022-09-12 NOTE — Discharge Summary (Signed)
Physician Discharge Summary  Patient ID: John [name] MRN: [id number] DOB/AGE: 02/28/1966 56 y.o.  Admit date: 09/11/2022 Discharge date: 09/12/2022  Admission Diagnoses: Chronic sinusitis  Discharge Diagnoses:  Principal Problem:   Chronic pansinusitis   Discharged Condition: good  Hospital Course: No complications overnight  Consults: none  Significant Diagnostic Studies: none  Treatments: surgery: Endoscopic sinus surgery  Discharge Exam: Blood pressure (!) 100/57, pulse 72, temperature 97.6 F (36.4 C), temperature source Oral, resp. rate 18, height 6' (1.829 m), weight (!) 188.2 kg, SpO2 96 %. PHYSICAL EXAM: Awake and alert.  No active bleeding.  Breathing well.  Disposition: Discharge disposition: 01-Home or Self Care       Discharge Instructions     Diet - low sodium heart healthy   Complete by: As directed    Increase activity slowly   Complete by: As directed    No wound care   Complete by: As directed       Allergies as of 09/12/2022       Reactions   Penicillins Shortness Of Breath, Rash   Has patient had a PCN reaction causing immediate rash, facial/tongue/throat swelling, SOB or lightheadedness with hypotension: No Has patient had a PCN reaction causing severe rash involving mucus membranes or skin necrosis: No Has patient had a PCN reaction that required hospitalization No Has patient had a PCN reaction occurring within the last 10 years: No If all of the above answers are "NO", then may proceed with Cephalosporin use.        Medication List     TAKE these medications    aspirin EC 325 MG tablet Take 325 mg by mouth daily.   cetirizine 10 MG tablet Commonly known as: ZYRTEC Take 10 mg by mouth daily as needed for allergies.   clindamycin 300 MG capsule Commonly known as: Cleocin Take 1 capsule (300 mg total) by mouth 3 (three) times daily.   clobetasol cream 0.05 % Commonly known as: TEMOVATE Apply 1 application  topically 2 (two) times daily as needed (for cellulitis).   cyanocobalamin 1000 MCG/ML injection Commonly known as: VITAMIN B12 Inject 500 mcg into the muscle every 30 (thirty) days.   cyclobenzaprine 10 MG tablet Commonly known as: FLEXERIL Take 10 mg by mouth 3 (three) times daily as needed for muscle spasms.   gabapentin 100 MG capsule Commonly known as: NEURONTIN Take 100 mg by mouth 2 (two) times daily.   gabapentin 300 MG capsule Commonly known as: NEURONTIN Take 300 mg by mouth at bedtime.   GLUCOSAMINE CHOND COMPLEX/MSM PO Take 2 tablets by mouth daily.   hydrochlorothiazide 25 MG tablet Commonly known as: HYDRODIURIL Take 25 mg by mouth daily.   lisinopril 10 MG tablet Commonly known as: ZESTRIL Take 10 mg by mouth daily.   Matzim LA 240 MG 24 hr tablet Generic drug: diltiazem Take 240 mg by mouth 2 (two) times daily.   meloxicam 15 MG tablet Commonly known as: MOBIC Take 15 mg by mouth daily.   metoprolol succinate 50 MG 24 hr tablet Commonly known as: TOPROL-XL Take 50 mg by mouth daily. Take with or immediately following a meal.   mupirocin ointment 2 % Commonly known as: BACTROBAN Apply 1 Application topically 2 (two) times daily as needed (wound care).   omeprazole 40 MG capsule Commonly known as: PRILOSEC Take 40 mg by mouth daily as needed (for acid reflux).   oxyCODONE-acetaminophen 5-325 MG tablet Commonly known as: PERCOCET/ROXICET Take 1 tablet by mouth every 4 (  four) hours as needed for severe pain.   oxymetazoline 0.05 % nasal spray Commonly known as: AFRIN Place 1 spray into both nostrils 2 (two) times daily as needed for congestion.   potassium chloride 10 MEQ tablet Commonly known as: KLOR-CON Take 10 mEq by mouth every evening.   sodium chloride 0.65 % Soln nasal spray Commonly known as: OCEAN Place 1 spray into both nostrils as needed for congestion.   torsemide 20 MG tablet Commonly known as: DEMADEX Take 20 mg by mouth  daily.         Signed: Izora Gala 09/12/2022, 8:58 AM

## 2022-09-12 NOTE — Discharge Instructions (Signed)
Use saline nasal spray every hour while awake.

## 2022-10-22 ENCOUNTER — Encounter: Payer: Self-pay | Admitting: *Deleted

## 2023-04-15 ENCOUNTER — Other Ambulatory Visit: Payer: Self-pay

## 2023-04-15 ENCOUNTER — Encounter: Payer: Self-pay | Admitting: Emergency Medicine

## 2023-04-15 ENCOUNTER — Ambulatory Visit (HOSPITAL_COMMUNITY)
Admit: 2023-04-15 | Discharge: 2023-04-15 | Disposition: A | Discharge status: Home / Self Care | Payer: 59 | Attending: *Deleted | Admitting: *Deleted

## 2023-04-15 ENCOUNTER — Ambulatory Visit
Admission: EM | Admit: 2023-04-15 | Discharge: 2023-04-15 | Disposition: A | Discharge status: Home / Self Care | Payer: 59 | Attending: Nurse Practitioner | Admitting: Nurse Practitioner

## 2023-04-15 DIAGNOSIS — M7989 Other specified soft tissue disorders: Secondary | ICD-10-CM | POA: Diagnosis not present

## 2023-04-15 MED ORDER — DOXYCYCLINE HYCLATE 100 MG PO CAPS: 100.0000 mg | ORAL_CAPSULE | Freq: Two times a day (BID) | ORAL | 0 refills | Status: AC

## 2023-04-15 NOTE — ED Provider Notes (Signed)
[facility]    CSN: [id number] Arrival date & time: 04/15/23  1209      History   Chief Complaint Chief Complaint  Patient presents with   Leg Swelling    HPI [name] is a 57 y.o. male.   Patient presents today with right lower extremity swelling, redness, and pain.  Reports history of cellulitis of the left lower extremity that felt similar a few years ago.  He endorses fever and nausea, however no vomiting.  No change in appetite, body aches, or chills.  No significant cough, congestion, sore throat, chest pain, or shortness of breath.  Patient endorses baseline numbness/tingling in his toes.  No lower extremity weakness, recent injury, or giving way sensation.  Patient denies recent travel, no recent surgeries, does not currently take blood thinners.  Patient denies antibiotic use in the past 90 days.    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 no Seizures since then and on no meds.   Sleep apnea    uses CPAP   Venous insufficiency of lower extremity     Patient Active Problem List   Diagnosis Date Noted   Chronic pansinusitis 09/11/2022   OBESITY 03/29/2010   CHEST PAIN 03/29/2010   SLEEP APNEA 05/15/2009    Past Surgical History:  Procedure Laterality Date   APPENDECTOMY     CYSTOSCOPY WITH STENT PLACEMENT Left 08/28/2016   Procedure: CYSTOSCOPY WITH  LEFT URETERAL STENT PLACEMENT;  Surgeon: Malen Gauze, MD;  Location: [facility];  Service: Urology;  Laterality: Left;   CYSTOSCOPY WITH STENT PLACEMENT Left 09/18/2016   Procedure: CYSTOSCOPY WITH STENT PLACEMENT;  Surgeon: Malen Gauze, MD;  Location: [facility];  Service: Urology;  Laterality: Left;   CYSTOSCOPY/RETROGRADE/URETEROSCOPY/STONE EXTRACTION WITH BASKET Left 08/28/2016   Procedure: CYSTOSCOPYLEFT RETROGRADE PYELOGRAM, DIAGNOSITIC LEFT URETEROSCOPY;  Surgeon: Malen Gauze, MD;  Location: [facility];  Service: Urology;  Laterality: Left;   CYSTOSCOPY/RETROGRADE/URETEROSCOPY/STONE EXTRACTION WITH BASKET Left 09/18/2016   Procedure: CYSTOSCOPY/RETROGRADE/URETEROSCOPY/STONE EXTRACTION WITH BASKET;  Surgeon: Malen Gauze, MD;  Location: [facility];  Service: Urology;  Laterality: Left;   HOLMIUM LASER APPLICATION Left 09/18/2016   Procedure: HOLMIUM LASER APPLICATION;  Surgeon: Malen Gauze, MD;  Location: [facility];  Service: Urology;  Laterality: Left;   KNEE ARTHROSCOPY Left    x2   MAXILLARY ANTROSTOMY N/A 09/11/2022   Procedure: MAXILLARY ANTROSTOMY WITH REMOVAL OF TISSUE;  Surgeon: Serena Colonel, MD;  Location: [facility] OR;  Service: ENT;  Laterality: N/A;   MOUTH SURGERY Left    jawx2' has plate and bone graft of jaw; graft was from hip.   NASAL SINUS SURGERY Right 09/11/2022   Procedure: FRONTAL ETHMOID AND MAXILLARY ENDOSCOPIC SINUS SURGERY;  Surgeon: Serena Colonel, MD;  Location: [facility] OR;  Service: ENT;  Laterality: Right;   POLYPECTOMY Right  09/11/2022   Procedure: RIGHT ENDOSCOPIC POLYPECTOMY;  Surgeon: Serena Colonel, MD;  Location: [facility] OR;  Service: ENT;  Laterality: Right;   SHOULDER SURGERY Left    trimmed cartiledge   TOOTH EXTRACTION Bilateral 09/15/2020   Procedure: DENTAL RESTORATION/EXTRACTIONS;  Surgeon: Ocie Doyne, DDS;  Location: [facility] OR;  Service: Oral Surgery;  Laterality: Bilateral;       Home Medications    Prior to Admission medications   Medication  Sig Start Date End Date Taking? Authorizing Provider  doxycycline (VIBRAMYCIN) 100 MG capsule Take 1 capsule (100 mg total) by mouth 2 (two) times daily for 7 days. 04/15/23 04/22/23 Yes Valentino Nose, NP  aspirin EC 325 MG tablet Take 325 mg by mouth daily.    [provider]  cetirizine (ZYRTEC) 10 MG tablet Take 10 mg by mouth daily as needed for allergies.     [provider]  clobetasol (TEMOVATE) 0.05 % cream Apply 1 application topically 2 (two) times daily as needed (for cellulitis).     [provider]  cyanocobalamin (VITAMIN B12) 1000 MCG/ML injection Inject 500 mcg into the muscle every 30 (thirty) days.    [provider]  cyclobenzaprine (FLEXERIL) 10 MG tablet Take 10 mg by mouth 3 (three) times daily as needed for muscle spasms.     [provider]  diltiazem (MATZIM LA) 240 MG 24 hr tablet Take 240 mg by mouth 2 (two) times daily.    [provider]  gabapentin (NEURONTIN) 100 MG capsule Take 100 mg by mouth 2 (two) times daily. 08/19/22   [provider]  gabapentin (NEURONTIN) 300 MG capsule Take 300 mg by mouth at bedtime. 08/19/22   [provider]  hydrochlorothiazide (HYDRODIURIL) 25 MG tablet Take 25 mg by mouth daily.    [provider]  lisinopril (ZESTRIL) 10 MG tablet Take 10 mg by mouth daily. 06/25/22   [provider]  meloxicam (MOBIC) 15 MG tablet Take 15 mg by mouth daily. 08/12/22   [provider]  metoprolol succinate (TOPROL-XL) 50 MG 24 hr tablet Take 50 mg by mouth daily. Take with or immediately following a meal.    [provider]  Misc Natural Products (GLUCOSAMINE CHOND COMPLEX/MSM PO) Take 2 tablets by mouth daily.    [provider]  mupirocin ointment (BACTROBAN) 2 % Apply 1 Application topically 2 (two) times daily as needed (wound care).    [provider]  omeprazole (PRILOSEC) 40 MG capsule Take 40 mg by mouth daily as needed  (for acid reflux).  12/20/13   [provider]  oxyCODONE-acetaminophen (PERCOCET/ROXICET) 5-325 MG tablet Take 1 tablet by mouth every 4 (four) hours as needed for severe pain.    [provider]  oxymetazoline (AFRIN) 0.05 % nasal spray Place 1 spray into both nostrils 2 (two) times daily as needed for congestion.    [provider]  potassium chloride (KLOR-CON) 10 MEQ tablet Take 10 mEq by mouth every evening. 06/24/22   [provider]  sodium chloride (OCEAN) 0.65 % SOLN nasal spray Place 1 spray into both nostrils as needed for congestion.    [provider]  torsemide (DEMADEX) 20 MG tablet Take 20 mg by mouth daily. 07/13/22   [provider]    Family History Family History  Problem Relation Age of Onset   Diabetes Mother    Cancer Mother    Cancer Father    Cancer Other     Social History Social  History   Tobacco Use   Smoking status: Never   Smokeless tobacco: Never  Vaping Use   Vaping Use: Never used  Substance Use Topics   Alcohol use: Yes    Comment: occasionally-2-3 times per year per pt   Drug use: No     Allergies   Penicillins   Review of Systems Review of Systems Per HPI  Physical Exam Triage Vital Signs ED Triage Vitals  Enc Vitals Group     BP 04/15/23 1405 (!) 151/99     Pulse Rate 04/15/23 1405 80     Resp 04/15/23 1405 20     Temp 04/15/23 1405 97.8 F (36.6 C)     Temp Source 04/15/23 1405 Oral     SpO2 04/15/23 1405 96 %     Weight --      Height --      Head Circumference --      Peak Flow --      Pain Score 04/15/23 1403 7     Pain Loc --      Pain Edu? --      Excl. in GC? --    No data found.  Updated Vital Signs BP (!) 151/99 (BP Location: Right Arm)   Pulse 80   Temp 97.8 F (36.6 C) (Oral)   Resp 20   SpO2 96%   Visual Acuity Right Eye Distance:   Left Eye Distance:   Bilateral Distance:    Right Eye Near:   Left Eye Near:    Bilateral Near:     Physical  Exam Vitals and nursing note reviewed.  Constitutional:      General: He is not in acute distress.    Appearance: Normal appearance. He is not toxic-appearing.  HENT:     Head: Normocephalic and atraumatic.     Mouth/Throat:     Mouth: Mucous membranes are moist.     Pharynx: Oropharynx is clear. No oropharyngeal exudate or posterior oropharyngeal erythema.  Cardiovascular:     Rate and Rhythm: Normal rate and regular rhythm.  Pulmonary:     Effort: Pulmonary effort is normal. No respiratory distress.     Breath sounds: Normal breath sounds. No wheezing, rhonchi or rales.  Musculoskeletal:     Right lower leg: Tenderness present. Edema present.     Comments: Erythema present to right lower extremity.  Edema is nonpitting.  Denna Haggard' sign difficult to perform secondary to body habitus.  Neurological:     Mental Status: He is alert and oriented to person, place, and time.  Psychiatric:        Behavior: Behavior is cooperative.      UC Treatments / Results  Labs (all labs ordered are listed, but only abnormal results are displayed) Labs Reviewed - No data to display  EKG   Radiology No results found.  Procedures Procedures (including critical care time)  Medications Ordered in UC Medications - No data to display  Initial Impression / Assessment and Plan / UC Course  I have reviewed the triage vital signs and the nursing notes.  Pertinent labs & imaging results that were available during my care of the patient were reviewed by me and considered in my medical decision making (see chart for details).   Patient is well-appearing, normotensive, afebrile, not tachycardic, not tachypneic, oxygenating well on room air.    1. Swelling of right lower extremity Suspect cellulitis, however given the unilateral swelling and pain, DVT rule out is indicated Stat ultrasound  ordered of right lower extremity Start doxycycline in the meantime Strict ER precautions discussed with  patient  The patient was given the opportunity to ask questions.  All questions answered to their satisfaction.  The patient is in agreement to this plan.    Final Clinical Impressions(s) / UC Diagnoses   Final diagnoses:  Swelling of right lower extremity     Discharge Instructions      Please go to [facility] Radiology department today to have the ultrasound of your right lower extremity and make sure there is not a blood clot   Start the doxycycline to treat for cellulitis in the leg.  If symptoms do not improve with this treatment, go to ER.    ED Prescriptions     Medication Sig Dispense Auth. Provider   doxycycline (VIBRAMYCIN) 100 MG capsule Take 1 capsule (100 mg total) by mouth 2 (two) times daily for 7 days. 14 capsule Valentino Nose, NP      PDMP not reviewed this encounter.   Valentino Nose, NP 04/15/23 [phone]

## 2023-04-15 NOTE — Discharge Instructions (Addendum)
Please go to [facility] Radiology department today to have the ultrasound of your right lower extremity and make sure there is not a blood clot   Start the doxycycline to treat for cellulitis in the leg.  If symptoms do not improve with this treatment, go to ER.

## 2023-04-15 NOTE — ED Triage Notes (Signed)
Pt reports history of cellulitis and reports most recent flare-up started on Saturday. Denies any known injury reports intermittent fever, nausea.

## 2023-04-15 NOTE — ED Notes (Signed)
Contacted central scheduling and reported pt could present to [facility] radi[facility] and have [place] completed this afternoon. NP and Pt aware.

## 2023-06-06 ENCOUNTER — Encounter: Payer: Self-pay | Admitting: Vascular Surgery

## 2023-07-30 ENCOUNTER — Other Ambulatory Visit: Payer: Self-pay | Admitting: *Deleted

## 2023-07-30 DIAGNOSIS — I890 Lymphedema, not elsewhere classified: Secondary | ICD-10-CM

## 2023-08-07 ENCOUNTER — Encounter (HOSPITAL_COMMUNITY): Payer: 59

## 2024-01-26 ENCOUNTER — Emergency Department (HOSPITAL_COMMUNITY)
Admission: EM | Admit: 2024-01-26 | Discharge: 2024-01-26 | Disposition: A | Discharge status: Home / Self Care | Attending: Emergency Medicine | Admitting: Emergency Medicine

## 2024-01-26 ENCOUNTER — Other Ambulatory Visit: Payer: Self-pay

## 2024-01-26 ENCOUNTER — Encounter (HOSPITAL_COMMUNITY): Payer: Self-pay

## 2024-01-26 ENCOUNTER — Emergency Department (HOSPITAL_COMMUNITY)

## 2024-01-26 DIAGNOSIS — R059 Cough, unspecified: Secondary | ICD-10-CM | POA: Diagnosis present

## 2024-01-26 DIAGNOSIS — Z7982 Long term (current) use of aspirin: Secondary | ICD-10-CM | POA: Diagnosis not present

## 2024-01-26 DIAGNOSIS — J209 Acute bronchitis, unspecified: Secondary | ICD-10-CM | POA: Diagnosis not present

## 2024-01-26 DIAGNOSIS — R600 Localized edema: Secondary | ICD-10-CM | POA: Diagnosis not present

## 2024-01-26 DIAGNOSIS — J40 Bronchitis, not specified as acute or chronic: Secondary | ICD-10-CM

## 2024-01-26 DIAGNOSIS — J101 Influenza due to other identified influenza virus with other respiratory manifestations: Secondary | ICD-10-CM | POA: Diagnosis not present

## 2024-01-26 LAB — COMPREHENSIVE METABOLIC PANEL
ALT: 36 U/L (ref 0–44)
AST: 52 U/L — ABNORMAL HIGH (ref 15–41)
Albumin: 3.4 g/dL — ABNORMAL LOW (ref 3.5–5.0)
Alkaline Phosphatase: 100 U/L (ref 38–126)
Anion gap: 9 (ref 5–15)
BUN: 11 mg/dL (ref 6–20)
CO2: 27 mmol/L (ref 22–32)
Calcium: 8.3 mg/dL — ABNORMAL LOW (ref 8.9–10.3)
Chloride: 103 mmol/L (ref 98–111)
Creatinine, Ser: 0.91 mg/dL (ref 0.61–1.24)
GFR, Estimated: >60 mL/min (ref >60)
Glucose, Bld: 126 mg/dL — ABNORMAL HIGH (ref 70–99)
Potassium: 3.9 mmol/L (ref 3.5–5.1)
Sodium: 139 mmol/L (ref 135–145)
Total Bilirubin: 0.9 mg/dL (ref 0.0–1.2)
Total Protein: 6.6 g/dL (ref 6.5–8.1)

## 2024-01-26 LAB — CBC WITH DIFFERENTIAL/PLATELET
Abs Immature Granulocytes: 0.01 10*3/uL (ref 0.00–0.07)
Basophils Absolute: 0 10*3/uL (ref 0.0–0.1)
Basophils Relative: 0 %
Eosinophils Absolute: 0.1 10*3/uL (ref 0.0–0.5)
Eosinophils Relative: 2 %
HCT: 50.6 % (ref 39.0–52.0)
Hemoglobin: 15.6 g/dL (ref 13.0–17.0)
Immature Granulocytes: 0 %
Lymphocytes Relative: 24 %
Lymphs Abs: 1.4 10*3/uL (ref 0.7–4.0)
MCH: 26.9 pg (ref 26.0–34.0)
MCHC: 30.8 g/dL (ref 30.0–36.0)
MCV: 87.1 fL (ref 80.0–100.0)
Monocytes Absolute: 0.6 10*3/uL (ref 0.1–1.0)
Monocytes Relative: 11 %
Neutro Abs: 3.6 10*3/uL (ref 1.7–7.7)
Neutrophils Relative %: 63 %
Platelets: 149 10*3/uL — ABNORMAL LOW (ref 150–400)
RBC: 5.81 MIL/uL (ref 4.22–5.81)
RDW: 14.6 % (ref 11.5–15.5)
WBC: 5.7 10*3/uL (ref 4.0–10.5)
nRBC: 0 % (ref 0.0–0.2)

## 2024-01-26 LAB — RESP PANEL BY RT-PCR (RSV, FLU A&B, COVID)  RVPGX2
Influenza A by PCR: POSITIVE — AB
Influenza B by PCR: NEGATIVE
Resp Syncytial Virus by PCR: NEGATIVE
SARS Coronavirus 2 by RT PCR: NEGATIVE

## 2024-01-26 LAB — TROPONIN I (HIGH SENSITIVITY): Troponin I (High Sensitivity): 5 ng/L (ref <18)

## 2024-01-26 LAB — BRAIN NATRIURETIC PEPTIDE: B Natriuretic Peptide: 112 pg/mL — ABNORMAL HIGH (ref 0.0–100.0)

## 2024-01-26 MED ORDER — DOXYCYCLINE HYCLATE 100 MG PO CAPS: 100.0000 mg | ORAL_CAPSULE | Freq: Two times a day (BID) | ORAL | 0 refills | Status: DC

## 2024-01-26 MED ORDER — IPRATROPIUM-ALBUTEROL 0.5-2.5 (3) MG/3ML IN SOLN
3.0000 mL | Freq: Once | RESPIRATORY_TRACT | Status: AC
  Administered 2024-01-26: 3 mL via RESPIRATORY_TRACT
  Filled 2024-01-26: qty 3

## 2024-01-26 MED ORDER — ALBUTEROL SULFATE HFA 108 (90 BASE) MCG/ACT IN AERS: 1.0000 | INHALATION_SPRAY | Freq: Four times a day (QID) | RESPIRATORY_TRACT | 0 refills | Status: AC | PRN

## 2024-01-26 MED ORDER — PREDNISONE 10 MG PO TABS: 40.0000 mg | ORAL_TABLET | Freq: Every day | ORAL | 0 refills | Status: AC

## 2024-01-26 MED ORDER — METHYLPREDNISOLONE SODIUM SUCC 125 MG IJ SOLR
125.0000 mg | Freq: Once | INTRAMUSCULAR | Status: AC
  Administered 2024-01-26: 125 mg via INTRAVENOUS
  Filled 2024-01-26: qty 2

## 2024-01-26 MED ORDER — DOXYCYCLINE HYCLATE 100 MG PO TABS: 100.0000 mg | ORAL_TABLET | Freq: Two times a day (BID) | ORAL | 0 refills | Status: AC

## 2024-01-26 MED ORDER — DOXYCYCLINE HYCLATE 100 MG PO TABS
100.0000 mg | ORAL_TABLET | Freq: Once | ORAL | Status: AC
  Administered 2024-01-26: 100 mg via ORAL
  Filled 2024-01-26: qty 1

## 2024-01-26 NOTE — ED Provider Notes (Signed)
 [facility] EMERGENCY DEPART[facility] AT [facility] Provider Note   CSN: [id number] Arrival date & time: 01/26/24  1507     History  Chief Complaint  Patient presents with   Cough    John [name] is a 58 y.o. male.  Patient is a 58 year old male who presents emergency department with a chief complaint of cough, congestion, shortness of breath, generalized malaise and fatigue which has been ongoing for approximate the past week.  Patient notes that he was evaluated by his primary care doctor last week and diagnosed with influenza.  He notes he was placed on Xofluza but notes that he was not able to start taking it the day that he was prescribed.  He does note that he has had a cough productive of a brown sputum.  He denies any associate abdominal pain, nausea, vomiting, diarrhea.  He denies any increased lower extremity edema.  He denies any dizziness, lightheadedness or syncope.   Cough Associated symptoms: shortness of breath        Home Medications Prior to Admission medications   Medication Sig Start Date End Date Taking? Authorizing Provider  aspirin EC 325 MG tablet Take 325 mg by mouth daily.    [provider]  cetirizine (ZYRTEC) 10 MG tablet Take 10 mg by mouth daily as needed for allergies.     [provider]  clobetasol (TEMOVATE) 0.05 % cream Apply 1 application topically 2 (two) times daily as needed (for cellulitis).     [provider]  cyanocobalamin (VITAMIN B12) 1000 MCG/ML injection Inject 500 mcg into the muscle every 30 (thirty) days.    [provider]  cyclobenzaprine (FLEXERIL) 10 MG tablet Take 10 mg by mouth 3 (three) times daily as needed for muscle spasms.     [provider]  diltiazem (MATZIM LA) 240 MG 24 hr tablet Take 240 mg by mouth 2 (two) times daily.    [provider]  gabapentin (NEURONTIN) 100 MG capsule Take 100 mg by mouth 2 (two) times daily. 08/19/22   [provider]  gabapentin (NEURONTIN) 300 MG capsule Take 300 mg by mouth at bedtime. 08/19/22   [provider]  hydrochlorothiazide (HYDRODIURIL) 25 MG tablet Take 25 mg by mouth daily.    [provider]  lisinopril (ZESTRIL) 10 MG tablet Take 10 mg by mouth daily. 06/25/22   [provider]  meloxicam (MOBIC) 15 MG tablet Take 15 mg by mouth daily. 08/12/22   [provider]  metoprolol succinate (TOPROL-XL) 50 MG 24 hr tablet Take 50 mg by mouth daily. Take with or immediately following a meal.    [provider]  Misc Natural Products (GLUCOSAMINE CHOND COMPLEX/MSM PO) Take 2 tablets by mouth daily.    [provider]  mupirocin ointment (BACTROBAN) 2 % Apply 1 Application topically 2 (two) times daily as needed (wound care).    [provider]  omeprazole (PRILOSEC) 40 MG capsule Take 40 mg by mouth daily as needed (for acid reflux).  12/20/13   [provider]  oxyCODONE-acetaminophen (PERCOCET/ROXICET) 5-325 MG tablet Take 1 tablet by mouth every 4 (four) hours as needed for severe pain.    [provider]  oxymetazoline (AFRIN) 0.05 % nasal spray Place 1 spray into both nostrils 2 (two) times daily as needed for congestion.    [provider]  potassium chloride (KLOR-CON) 10 MEQ tablet Take 10 mEq by mouth every evening. 06/24/22   [provider]  sodium chloride (OCEAN) 0.65 % SOLN nasal spray Place 1 spray into both nostrils as needed for congestion.    [provider]  torsemide (DEMADEX) 20 MG tablet Take 20 mg by mouth daily. 07/13/22   [provider]      Allergies    Penicillins    Review of Systems   Review of Systems  Constitutional:  Positive for fatigue.  Respiratory:  Positive for cough and shortness of breath.   All other systems reviewed and are negative.   Physical Exam Updated Vital Signs BP 112/65   Pulse 67   Temp 98.4 F (36.9 C) (Oral)   Resp 20   Ht 6'  (1.829 m)   Wt (!) 189 kg   SpO2 93%   BMI 56.51 kg/m  Physical Exam Vitals and nursing note reviewed.  Constitutional:      Appearance: Normal appearance.  HENT:     Head: Normocephalic and atraumatic.     Nose: Nose normal.     Mouth/Throat:     Mouth: Mucous membranes are moist.  Eyes:     Extraocular Movements: Extraocular movements intact.     Conjunctiva/sclera: Conjunctivae normal.     Pupils: Pupils are equal, round, and reactive to light.  Cardiovascular:     Rate and Rhythm: Normal rate and regular rhythm.     Pulses: Normal pulses.     Heart sounds: Normal heart sounds. No murmur heard.    No gallop.  Pulmonary:     Effort: Pulmonary effort is normal. No respiratory distress.     Breath sounds: No stridor. Wheezing and rales present. No rhonchi.  Abdominal:     General: Abdomen is flat. Bowel sounds are normal. There is no distension.     Palpations: Abdomen is soft.     Tenderness: There is no abdominal tenderness. There is no guarding.  Musculoskeletal:        General: Normal range of motion.     Cervical back: Normal range of motion and neck supple.     Right lower leg: Edema present.     Left lower leg: Edema present.  Skin:    General: Skin is warm and dry.  Neurological:     General: No focal deficit present.     Mental Status: He is alert and oriented to person, place, and time. Mental status is at baseline.  Psychiatric:        Mood and Affect: Mood normal.        Behavior: Behavior normal.        Thought Content: Thought content normal.        Judgment: Judgment normal.     ED Results / Procedures / Treatments   Labs (all labs ordered are listed, but only abnormal results are displayed) Labs Reviewed  RESP PANEL BY RT-PCR (RSV, FLU A&B, COVID)  [id number] - Abnormal; Notable for the following components:      Result Value   Influenza A by PCR POSITIVE (*)    All other components within normal limits  COMPREHENSIVE METABOLIC PANEL  CBC WITH  DIFFERENTIAL/PLATELET  BRAIN NATRIURETIC PEPTIDE  TROPONIN I (HIGH SENSITIVITY)    EKG None  Radiology No results found.  Procedures Procedures    Medications Ordered in ED Medications  ipratropium-albuterol (DUONEB) 0.5-2.5 (3) MG/3ML nebulizer solution 3 mL (has no administration in time range)  ipratropium-albuterol (DUONEB) 0.5-2.5 (3) MG/3ML nebulizer solution 3 mL (has no administration in time range)  methylPREDNISolone sodium succinate (SOLU-MEDROL) 125  mg/2 mL injection 125 mg (has no administration in time range)    ED Course/ Medical Decision Making/ A&P                                 Medical Decision Making Amount and/or Complexity of Data Reviewed Labs: ordered. Radiology: ordered.  Risk Prescription drug management.   This patient presents to the ED for concern of cough, congestion, generalized malaise and fatigue, shortness of breath differential diagnosis includes acute viral syndrome, pneumonia, ACS, pulmonary embolus, pneumothorax, hemothorax, pericarditis, myocarditis, endocarditis    Additional history obtained:  Additional history obtained from none External records from outside source obtained and reviewed including none   Lab Tests:  I Ordered, and personally interpreted labs.  The pertinent results include: Mildly elevated BNP, elevated glucose, no leukocytosis, no anemia, normal liver function and kidney function   Imaging Studies ordered:  I ordered imaging studies including chest x-ray I independently visualized and interpreted imaging which showed no acute cardiopulmonary process I agree with the radiologist interpretation   Medicines ordered and prescription drug management:  I ordered medication including prednisone, doxycycline, albuterol for acute bronchitis Reevaluation of the patient after these medicines showed that the patient improved I have reviewed the patients home medicines and have made adjustments as  needed   Problem List / ED Course:  Patient is doing well at this time and is stable for discharge home.  Discussed with patient that we will continue symptomatic treatment on outpatient basis as will provide antibiotics for his increased productive cough.  Patient is influenza A positive but has been sick for approximate 1 week.  He is already been on Xofluza at this point.  He does have stable vital signs with no indication for sepsis and no associated hypoxia.  He was able to ambulate in the emergency department without any associated desaturations.  The need for close follow-up with primary care doctor on an outpatient basis was discussed as well as strict return precautions for any new or worsening symptoms.  Patient voiced understanding had no additional questions.   Social Determinants of Health:  None           Final Clinical Impression(s) / ED Diagnoses Final diagnoses:  None    Rx / DC Orders ED Discharge Orders     None         Kathlen Mody 01/26/24 1930    Pricilla Loveless, MD 01/27/24 1723

## 2024-01-26 NOTE — ED Triage Notes (Signed)
 Pt arrived via POV c/o persistent cough, congestion. Pt reports recent Dx of the Flu. Pt reports symptoms have not improved.

## 2024-01-26 NOTE — ED Notes (Addendum)
 Pt ambulated w/ pulse ox. Oxygen remained above 92% while on RA. Pt was visibly SOB.

## 2024-01-26 NOTE — Discharge Instructions (Addendum)
 Please take all medications as directed.  Follow-up closely with your primary care doctor on an outpatient basis.  Return to emergency department immediately for any new or worsening symptoms.

## 2024-06-16 ENCOUNTER — Other Ambulatory Visit (HOSPITAL_COMMUNITY): Payer: Self-pay | Admitting: Geriatric Medicine

## 2024-06-16 DIAGNOSIS — K760 Fatty (change of) liver, not elsewhere classified: Secondary | ICD-10-CM

## 2024-06-23 ENCOUNTER — Ambulatory Visit (HOSPITAL_COMMUNITY)
Admission: RE | Admit: 2024-06-23 | Discharge: 2024-06-23 | Disposition: A | Discharge status: Home / Self Care | Source: Ambulatory Visit | Attending: Geriatric Medicine | Admitting: Geriatric Medicine

## 2024-06-23 DIAGNOSIS — K760 Fatty (change of) liver, not elsewhere classified: Secondary | ICD-10-CM | POA: Insufficient documentation

## 2024-12-17 ENCOUNTER — Other Ambulatory Visit: Payer: Self-pay | Admitting: Internal Medicine

## 2024-12-17 DIAGNOSIS — M25511 Pain in right shoulder: Secondary | ICD-10-CM

## 2024-12-31 ENCOUNTER — Other Ambulatory Visit

## 2025-02-11 ENCOUNTER — Ambulatory Visit: Admitting: Internal Medicine
